# Patient Record
Sex: Female | Born: 1960 | Hispanic: Yes | Marital: Married | Smoking: Never smoker
Health system: Southern US, Community
[De-identification: ages and names within clinical notes are randomized; demographics above are authoritative.]

## PROBLEM LIST (undated history)

## (undated) HISTORY — PX: TONSILLECTOMY: SUR1361

---

## 2017-01-16 ENCOUNTER — Emergency Department (HOSPITAL_BASED_OUTPATIENT_CLINIC_OR_DEPARTMENT_OTHER): Payer: PRIVATE HEALTH INSURANCE

## 2017-01-16 ENCOUNTER — Other Ambulatory Visit: Payer: Self-pay

## 2017-01-16 ENCOUNTER — Encounter (HOSPITAL_BASED_OUTPATIENT_CLINIC_OR_DEPARTMENT_OTHER): Payer: Self-pay | Admitting: *Deleted

## 2017-01-16 ENCOUNTER — Inpatient Hospital Stay (HOSPITAL_BASED_OUTPATIENT_CLINIC_OR_DEPARTMENT_OTHER)
Admission: EM | Admit: 2017-01-16 | Discharge: 2017-01-26 | DRG: 417 | Disposition: A | Discharge status: Home / Self Care | Payer: PRIVATE HEALTH INSURANCE | Attending: Internal Medicine | Admitting: Internal Medicine

## 2017-01-16 DIAGNOSIS — K851 Biliary acute pancreatitis without necrosis or infection: Secondary | ICD-10-CM | POA: Diagnosis not present

## 2017-01-16 DIAGNOSIS — R0602 Shortness of breath: Secondary | ICD-10-CM

## 2017-01-16 DIAGNOSIS — K8591 Acute pancreatitis with uninfected necrosis, unspecified: Secondary | ICD-10-CM | POA: Diagnosis not present

## 2017-01-16 DIAGNOSIS — K8050 Calculus of bile duct without cholangitis or cholecystitis without obstruction: Secondary | ICD-10-CM

## 2017-01-16 DIAGNOSIS — E1165 Type 2 diabetes mellitus with hyperglycemia: Secondary | ICD-10-CM | POA: Diagnosis present

## 2017-01-16 DIAGNOSIS — K810 Acute cholecystitis: Secondary | ICD-10-CM | POA: Diagnosis not present

## 2017-01-16 DIAGNOSIS — K8020 Calculus of gallbladder without cholecystitis without obstruction: Secondary | ICD-10-CM | POA: Diagnosis present

## 2017-01-16 DIAGNOSIS — E119 Type 2 diabetes mellitus without complications: Secondary | ICD-10-CM

## 2017-01-16 DIAGNOSIS — K8000 Calculus of gallbladder with acute cholecystitis without obstruction: Secondary | ICD-10-CM | POA: Diagnosis not present

## 2017-01-16 DIAGNOSIS — E785 Hyperlipidemia, unspecified: Secondary | ICD-10-CM | POA: Diagnosis present

## 2017-01-16 DIAGNOSIS — E876 Hypokalemia: Secondary | ICD-10-CM | POA: Diagnosis present

## 2017-01-16 DIAGNOSIS — J90 Pleural effusion, not elsewhere classified: Secondary | ICD-10-CM | POA: Diagnosis present

## 2017-01-16 DIAGNOSIS — R109 Unspecified abdominal pain: Secondary | ICD-10-CM | POA: Diagnosis present

## 2017-01-16 DIAGNOSIS — R1011 Right upper quadrant pain: Secondary | ICD-10-CM

## 2017-01-16 DIAGNOSIS — K8063 Calculus of gallbladder and bile duct with acute cholecystitis with obstruction: Secondary | ICD-10-CM | POA: Diagnosis not present

## 2017-01-16 DIAGNOSIS — R945 Abnormal results of liver function studies: Secondary | ICD-10-CM | POA: Diagnosis present

## 2017-01-16 DIAGNOSIS — K8067 Calculus of gallbladder and bile duct with acute and chronic cholecystitis with obstruction: Secondary | ICD-10-CM | POA: Diagnosis present

## 2017-01-16 DIAGNOSIS — R197 Diarrhea, unspecified: Secondary | ICD-10-CM | POA: Diagnosis present

## 2017-01-16 DIAGNOSIS — Z419 Encounter for procedure for purposes other than remedying health state, unspecified: Secondary | ICD-10-CM

## 2017-01-16 DIAGNOSIS — K819 Cholecystitis, unspecified: Secondary | ICD-10-CM

## 2017-01-16 DIAGNOSIS — K8071 Calculus of gallbladder and bile duct without cholecystitis with obstruction: Secondary | ICD-10-CM

## 2017-01-16 DIAGNOSIS — R748 Abnormal levels of other serum enzymes: Secondary | ICD-10-CM

## 2017-01-16 DIAGNOSIS — K7689 Other specified diseases of liver: Secondary | ICD-10-CM | POA: Diagnosis not present

## 2017-01-16 DIAGNOSIS — R739 Hyperglycemia, unspecified: Secondary | ICD-10-CM | POA: Diagnosis present

## 2017-01-16 DIAGNOSIS — K56609 Unspecified intestinal obstruction, unspecified as to partial versus complete obstruction: Secondary | ICD-10-CM

## 2017-01-16 DIAGNOSIS — K8511 Biliary acute pancreatitis with uninfected necrosis: Secondary | ICD-10-CM | POA: Diagnosis present

## 2017-01-16 LAB — COMPREHENSIVE METABOLIC PANEL
ALBUMIN: 4.7 g/dL (ref 3.5–5.0)
ALK PHOS: 157 U/L — AB (ref 38–126)
ALT: 871 U/L — ABNORMAL HIGH (ref 14–54)
ANION GAP: 15 (ref 5–15)
AST: 953 U/L — ABNORMAL HIGH (ref 15–41)
BILIRUBIN TOTAL: 2.6 mg/dL — AB (ref 0.3–1.2)
BUN: 20 mg/dL (ref 6–20)
CALCIUM: 9.6 mg/dL (ref 8.9–10.3)
CO2: 20 mmol/L — ABNORMAL LOW (ref 22–32)
Chloride: 102 mmol/L (ref 101–111)
Creatinine, Ser: 1 mg/dL (ref 0.44–1.00)
GFR calc non Af Amer: >60 mL/min (ref >60)
Glucose, Bld: 239 mg/dL — ABNORMAL HIGH (ref 65–99)
Potassium: 3.8 mmol/L (ref 3.5–5.1)
Sodium: 137 mmol/L (ref 135–145)
TOTAL PROTEIN: 8.5 g/dL — AB (ref 6.5–8.1)

## 2017-01-16 LAB — CBC WITH DIFFERENTIAL/PLATELET
BASOS ABS: 0 10*3/uL (ref 0.0–0.1)
BASOS PCT: 0 %
EOS ABS: 0 10*3/uL (ref 0.0–0.7)
EOS PCT: 0 %
HEMATOCRIT: 51.5 % — AB (ref 36.0–46.0)
Hemoglobin: 17.6 g/dL — ABNORMAL HIGH (ref 12.0–15.0)
Lymphocytes Relative: 5 %
Lymphs Abs: 0.6 10*3/uL — ABNORMAL LOW (ref 0.7–4.0)
MCH: 27.3 pg (ref 26.0–34.0)
MCHC: 34.2 g/dL (ref 30.0–36.0)
MCV: 80 fL (ref 78.0–100.0)
MONO ABS: 0.4 10*3/uL (ref 0.1–1.0)
Monocytes Relative: 3 %
NEUTROS ABS: 10.1 10*3/uL — AB (ref 1.7–7.7)
Neutrophils Relative %: 92 %
PLATELETS: 332 10*3/uL (ref 150–400)
RBC: 6.44 MIL/uL — ABNORMAL HIGH (ref 3.87–5.11)
RDW: 14.2 % (ref 11.5–15.5)
WBC: 11 10*3/uL — ABNORMAL HIGH (ref 4.0–10.5)

## 2017-01-16 LAB — LIPASE, BLOOD: LIPASE: 1907 U/L — AB (ref 11–51)

## 2017-01-16 MED ORDER — ONDANSETRON HCL 4 MG/2ML IJ SOLN
4.0000 mg | Freq: Three times a day (TID) | INTRAMUSCULAR | Status: DC | PRN
  Administered 2017-01-16 – 2017-01-18 (×3): 4 mg via INTRAVENOUS
  Filled 2017-01-16 (×3): qty 2

## 2017-01-16 MED ORDER — MORPHINE SULFATE (PF) 4 MG/ML IV SOLN
INTRAVENOUS | Status: AC
  Administered 2017-01-16: 4 mg via INTRAVENOUS
  Filled 2017-01-16: qty 1

## 2017-01-16 MED ORDER — PIPERACILLIN-TAZOBACTAM 3.375 G IVPB 30 MIN
3.3750 g | Freq: Once | INTRAVENOUS | Status: AC
  Administered 2017-01-16: 3.375 g via INTRAVENOUS
  Filled 2017-01-16 (×2): qty 50

## 2017-01-16 MED ORDER — HYDROMORPHONE HCL 1 MG/ML IJ SOLN
0.5000 mg | Freq: Once | INTRAMUSCULAR | Status: AC
  Administered 2017-01-16: 0.5 mg via INTRAVENOUS
  Filled 2017-01-16: qty 1

## 2017-01-16 MED ORDER — HYDROMORPHONE HCL 1 MG/ML IJ SOLN
1.0000 mg | INTRAMUSCULAR | Status: DC | PRN
  Administered 2017-01-16 (×2): 0.5 mg via INTRAVENOUS
  Filled 2017-01-16: qty 1

## 2017-01-16 MED ORDER — HYDROMORPHONE HCL 1 MG/ML IJ SOLN
INTRAMUSCULAR | Status: AC
  Administered 2017-01-16: 0.5 mg via INTRAVENOUS
  Filled 2017-01-16: qty 1

## 2017-01-16 MED ORDER — SODIUM CHLORIDE 0.9 % IV BOLUS (SEPSIS)
1000.0000 mL | Freq: Once | INTRAVENOUS | Status: AC
  Administered 2017-01-16: 1000 mL via INTRAVENOUS

## 2017-01-16 MED ORDER — ONDANSETRON HCL 4 MG/2ML IJ SOLN
4.0000 mg | Freq: Once | INTRAMUSCULAR | Status: AC
  Administered 2017-01-16: 4 mg via INTRAVENOUS

## 2017-01-16 MED ORDER — ONDANSETRON HCL 4 MG/2ML IJ SOLN
INTRAMUSCULAR | Status: AC
  Administered 2017-01-16: 4 mg via INTRAVENOUS
  Filled 2017-01-16: qty 2

## 2017-01-16 MED ORDER — SODIUM CHLORIDE 0.9 % IV SOLN
INTRAVENOUS | Status: DC
  Administered 2017-01-16: 23:00:00 via INTRAVENOUS

## 2017-01-16 MED ORDER — MORPHINE SULFATE (PF) 4 MG/ML IV SOLN
4.0000 mg | Freq: Once | INTRAVENOUS | Status: AC
  Administered 2017-01-16: 4 mg via INTRAVENOUS

## 2017-01-16 NOTE — ED Notes (Signed)
Patient transported to Ultrasound and CT. 

## 2017-01-16 NOTE — ED Triage Notes (Signed)
Abdominal pain and vomiting. Pain is in her right upper quadrant radiating into her back.

## 2017-01-16 NOTE — ED Notes (Signed)
Pt sister went home and took all pts clothes and jewelry except  Wedding ring and silver color bracelet. Left number to call when admitted; [place] [phone]

## 2017-01-16 NOTE — Progress Notes (Signed)
This is a no charge note  [name] [name]  56 year old lady without significant past medical history, who presents with nausea, vomiting, right upper quadrant abdominal pain. Patient was found to have abnormal liver function with AST 953, ALT 873, total bilirubin 2.6, ALP 157. Lipase 1907, Abdominal ultrasound showed multiple gallstones and cholecystitis.  WBC 11.0, electrolytes renal function okay, temperature 97.4, no tachycardia, no tachypnea, blood pressure 178/72, blood sugar 279,. Patient is admitted to MedSurg bed as inpatient. Gen. surgeon, Dr. Michaell CowingGross was consulted by EDP, recommended to consult GI for possible ERCP. Patient is started with IV Zosyn.  Please call manager of [facility] at [phone] when pt arrives to floor   [name], MD  [facility] Pager [phone]  If 7PM-7AM, please contact night-coverage [email or web address] Password [email or web address] 01/16/2017, 10:41 PM

## 2017-01-16 NOTE — ED Notes (Signed)
Report given to [facility], states 15mins out; [facility], states will call back for report

## 2017-01-16 NOTE — ED Notes (Signed)
ED Provider at bedside. 

## 2017-01-16 NOTE — ED Provider Notes (Signed)
[facility] EMERGENCY DEPARTMENT Provider Note   CSN: [id number] Arrival date & time: 01/16/17  1819     History   Chief Complaint Chief Complaint  Patient presents with  . Abdominal Pain    HPI Kristen Sharp is a 56 y.o. female.  HPI 56 year old female with no pertinent past medical history presents to the emergency department today with complaints of right upper quadrant abdominal pain.  The patient states the pain started this morning when she awoke from sleep.  States the pain is sharp and stabbing in nature.  States the pain is constant.  States she has had several episodes of nonbloody nonbilious emesis today.  Denies any change in her bowel habits, melena, hematochezia.  She is not take anything for the pain prior to arrival.  Patient denies any associated fever, chills, urinary symptoms, vaginal symptoms.  Nothing makes her symptoms better.  Palpation and movement makes the pain worse.  Patient has had poor p.o. intake today due to the pain.  No history of same.  Patient denies any history of abdominal surgeries.  Pt denies any fever, chill, ha, vision changes, lightheadedness, dizziness, congestion, neck pain, cp, sob, cough,  urinary symptoms, change in bowel habits, melena, hematochezia, lower extremity paresthesias.  History reviewed. No pertinent past medical history.  Patient Active Problem List   Diagnosis Date Noted  . Acute cholecystitis 01/16/2017  . Cholelithiasis 01/16/2017  . Abnormal liver function 01/16/2017  . Gallstone pancreatitis 01/16/2017  . Hyperglycemia 01/16/2017    Past Surgical History:  Procedure Laterality Date  . CESAREAN SECTION    . TONSILLECTOMY      OB History    No data available       Home Medications    Prior to Admission medications   Not on File    Family History No family history on file.  Social History Social History   Tobacco Use  . Smoking status: Never Smoker  . Smokeless tobacco: Never Used    Substance Use Topics  . Alcohol use: No    Frequency: Never  . Drug use: No     Allergies   Patient has no known allergies.   Review of Systems Review of Systems  Constitutional: Negative for chills and fever.  HENT: Negative for congestion.   Eyes: Negative for visual disturbance.  Respiratory: Negative for cough and shortness of breath.   Cardiovascular: Negative for chest pain.  Gastrointestinal: Positive for abdominal pain, nausea and vomiting. Negative for diarrhea.  Genitourinary: Negative for dysuria, flank pain, frequency, hematuria, urgency, vaginal bleeding and vaginal discharge.  Musculoskeletal: Negative for arthralgias and myalgias.  Skin: Negative for rash.  Neurological: Negative for dizziness, syncope, weakness, light-headedness, numbness and headaches.  Psychiatric/Behavioral: Negative for sleep disturbance. The patient is not nervous/anxious.      Physical Exam Updated Vital Signs BP (!) 178/72   Pulse 70   Temp (!) 97.4 F (36.3 C) (Oral)   Resp 20   Ht 5' (1.524 m)   Wt 73.5 kg (162 lb)   SpO2 98%   BMI 31.64 kg/m   Physical Exam  Constitutional: She is oriented to person, place, and time. She appears well-developed and well-nourished.  Non-toxic appearance. No distress.  HENT:  Head: Normocephalic and atraumatic.  Nose: Nose normal.  Mouth/Throat: Oropharynx is clear and moist.  Eyes: Conjunctivae are normal. Pupils are equal, round, and reactive to light. Right eye exhibits no discharge. Left eye exhibits no discharge. No scleral icterus.  Neck: Normal  range of motion. Neck supple.  Cardiovascular: Normal rate, regular rhythm, normal heart sounds and intact distal pulses. Exam reveals no gallop and no friction rub.  No murmur heard. Pulmonary/Chest: Effort normal and breath sounds normal. No stridor. No respiratory distress. She has no wheezes. She has no rales. She exhibits no tenderness.  Abdominal: Soft. Bowel sounds are normal. There is  generalized tenderness and tenderness in the right upper quadrant, epigastric area and left upper quadrant. There is positive Murphy's sign. There is no rigidity, no rebound, no guarding, no CVA tenderness and no tenderness at McBurney's point.  Musculoskeletal: Normal range of motion. She exhibits no tenderness.  Lymphadenopathy:    She has no cervical adenopathy.  Neurological: She is alert and oriented to person, place, and time.  Skin: Skin is warm and dry. Capillary refill takes less than 2 seconds.  Psychiatric: Her behavior is normal. Judgment and thought content normal.  Nursing note and vitals reviewed.    ED Treatments / Results  Labs (all labs ordered are listed, but only abnormal results are displayed) Labs Reviewed  COMPREHENSIVE METABOLIC PANEL - Abnormal; Notable for the following components:      Result Value   CO2 20 (*)    Glucose, Bld 239 (*)    Total Protein 8.5 (*)    AST 953 (*)    ALT 871 (*)    Alkaline Phosphatase 157 (*)    Total Bilirubin 2.6 (*)    All other components within normal limits  CBC WITH DIFFERENTIAL/PLATELET - Abnormal; Notable for the following components:   WBC 11.0 (*)    RBC 6.44 (*)    Hemoglobin 17.6 (*)    HCT 51.5 (*)    Neutro Abs 10.1 (*)    Lymphs Abs 0.6 (*)    All other components within normal limits  LIPASE, BLOOD - Abnormal; Notable for the following components:   Lipase 1,907 (*)    All other components within normal limits  URINALYSIS, ROUTINE W REFLEX MICROSCOPIC    EKG  EKG Interpretation None       Radiology Koreas Abdomen Limited Ruq  Result Date: 01/16/2017 CLINICAL DATA:  Severe right upper quadrant and epigastric pain for the last several hours. EXAM: ULTRASOUND ABDOMEN LIMITED RIGHT UPPER QUADRANT COMPARISON:  None. FINDINGS: Gallbladder: Gallbladder full of stones. Mild surrounding fluid. Largest stone measured at 1.7 cm. Positive Murphy sign. Common bile duct: Diameter: 3.6 mm common normal Liver:  Echogenic liver suggesting fatty change. No focal lesion or ductal dilatation. Portal vein is patent on color Doppler imaging with normal direction of blood flow towards the liver. There appears to be some fluid in the right upper quadrant that may be reactive to the inflammatory changes. IMPRESSION: Gallbladder full of stones. Positive Murphy sign. Mild surrounding fluid. Findings suggest cholecystitis. Fatty liver. Electronically Signed   By: Paulina FusiMark  Shogry M.D.   On: 01/16/2017 21:10    Procedures Procedures (including critical care time)  Medications Ordered in ED Medications  HYDROmorphone (DILAUDID) injection 1 mg (not administered)  ondansetron (ZOFRAN) injection 4 mg (not administered)  sodium chloride 0.9 % bolus 1,000 mL (0 mLs Intravenous Stopped 01/16/17 2015)  morphine 4 MG/ML injection 4 mg (4 mg Intravenous Given 01/16/17 1938)  ondansetron (ZOFRAN) injection 4 mg (4 mg Intravenous Given 01/16/17 1937)  HYDROmorphone (DILAUDID) injection 0.5 mg (0.5 mg Intravenous Given 01/16/17 2011)  HYDROmorphone (DILAUDID) 1 MG/ML injection (0.5 mg  Given 01/16/17 2052)  piperacillin-tazobactam (ZOSYN) IVPB 3.375 g (0 g  Intravenous Stopped 01/16/17 2158)     Initial Impression / Assessment and Plan / ED Course  I have reviewed the triage vital signs and the nursing notes.  Pertinent labs & imaging results that were available during my care of the patient were reviewed by me and considered in my medical decision making (see chart for details).     Patient resents to the ED for evaluation of right upper quadrant abdominal pain acute onset this a.m.  Reports associated emesis.  Vital signs are reassuring.  Patient is afebrile, no tachycardia, no hypotension noted.  Lab work noticeable for mild leukocytosis of 11,000.  She also has elevated liver enzymes and lipase of 1900.  Total bilirubin is elevated at 2.6.  Patient also has an elevated glucose of 239.  Mild hemoconcentration of  hemoglobin of 17.6.  On exam patient does have significant pain in the right upper quadrant epigastric region to palpation.  No signs of peritonitis.  Positive Murphy sign.  Lungs clear to auscultation bilaterally.  Heart regular rate and rhythm with no rubs murmurs or gallops.  No significant scleral icterus or jaundice noted.  Given the elevated liver enzymes and lipase concern for possible gallstone pancreatitis versus cholangitis versus acute cholecystitis.  Ultrasound will be ordered to evaluate.  Ultrasound returns that reveals a gallbladder full of gallstones with acute cholecystitis noted.  Normal common bile duct.  I spoke with Dr. gross with general surgery who recommends hospital admission and gastroenterology follow-up.  He will see patient in consultation in the hospital tomorrow.  Does not recommend any emergent surgical intervention at this time.  Patient will be given IV antibiotics.  Spoke with Dr. new with hospital medicine who agrees to admission will place admission orders.  Patient remains hemodynamically stable this time.  Pain is been controlled in the ED but is been very difficult.  Patient has no signs of peritonitis or a rigid abdomen.  [place] seen by my attending who is agreed with the above plan.  Final Clinical Impressions(s) / ED Diagnoses   Final diagnoses:  Elevated liver enzymes  RUQ pain  Elevated lipase  Cholecystitis    ED Discharge Orders    None       Wallace KellerLeaphart, Kenneth T, PA-C 01/17/17 0134    Pricilla LovelessGoldston, Scott, MD 01/18/17 [phone]

## 2017-01-17 ENCOUNTER — Inpatient Hospital Stay (HOSPITAL_COMMUNITY): Payer: PRIVATE HEALTH INSURANCE

## 2017-01-17 DIAGNOSIS — R1011 Right upper quadrant pain: Secondary | ICD-10-CM

## 2017-01-17 DIAGNOSIS — R109 Unspecified abdominal pain: Secondary | ICD-10-CM | POA: Diagnosis present

## 2017-01-17 DIAGNOSIS — R739 Hyperglycemia, unspecified: Secondary | ICD-10-CM

## 2017-01-17 DIAGNOSIS — K819 Cholecystitis, unspecified: Secondary | ICD-10-CM

## 2017-01-17 DIAGNOSIS — K7689 Other specified diseases of liver: Secondary | ICD-10-CM

## 2017-01-17 DIAGNOSIS — K8000 Calculus of gallbladder with acute cholecystitis without obstruction: Secondary | ICD-10-CM

## 2017-01-17 DIAGNOSIS — K810 Acute cholecystitis: Secondary | ICD-10-CM

## 2017-01-17 DIAGNOSIS — E119 Type 2 diabetes mellitus without complications: Secondary | ICD-10-CM

## 2017-01-17 LAB — URINALYSIS, ROUTINE W REFLEX MICROSCOPIC
BILIRUBIN URINE: NEGATIVE
Bacteria, UA: NONE SEEN
GLUCOSE, UA: 50 mg/dL — AB
Hgb urine dipstick: NEGATIVE
KETONES UR: 5 mg/dL — AB
LEUKOCYTES UA: NEGATIVE
Nitrite: NEGATIVE
PH: 5 (ref 5.0–8.0)
Protein, ur: 30 mg/dL — AB
SPECIFIC GRAVITY, URINE: 1.024 (ref 1.005–1.030)
SQUAMOUS EPITHELIAL / LPF: NONE SEEN

## 2017-01-17 LAB — GLUCOSE, CAPILLARY
GLUCOSE-CAPILLARY: 130 mg/dL — AB (ref 65–99)
GLUCOSE-CAPILLARY: 201 mg/dL — AB (ref 65–99)
Glucose-Capillary: 187 mg/dL — ABNORMAL HIGH (ref 65–99)
Glucose-Capillary: 138 mg/dL — ABNORMAL HIGH (ref 65–99)
Glucose-Capillary: 109 mg/dL — ABNORMAL HIGH (ref 65–99)

## 2017-01-17 LAB — PROTIME-INR
INR: 1
Prothrombin Time: 13.1 seconds (ref 11.4–15.2)

## 2017-01-17 LAB — APTT: APTT: 28 s (ref 24–36)

## 2017-01-17 LAB — HIV ANTIBODY (ROUTINE TESTING W REFLEX): HIV Screen 4th Generation wRfx: NONREACTIVE

## 2017-01-17 LAB — TRIGLYCERIDES: Triglycerides: 135 mg/dL (ref <150)

## 2017-01-17 MED ORDER — MAGIC MOUTHWASH
15.0000 mL | Freq: Four times a day (QID) | ORAL | Status: DC | PRN
  Administered 2017-01-23: 15 mL via ORAL
  Filled 2017-01-17: qty 15

## 2017-01-17 MED ORDER — MENTHOL 3 MG MT LOZG: 1.0000 | LOZENGE | OROMUCOSAL | Status: DC | PRN

## 2017-01-17 MED ORDER — PROCHLORPERAZINE EDISYLATE 5 MG/ML IJ SOLN: 5.0000 mg | INTRAMUSCULAR | Status: DC | PRN

## 2017-01-17 MED ORDER — INSULIN ASPART 100 UNIT/ML ~~LOC~~ SOLN
0.0000 [IU] | Freq: Three times a day (TID) | SUBCUTANEOUS | Status: DC
  Administered 2017-01-17: 1 [IU] via SUBCUTANEOUS
  Administered 2017-01-17: 2 [IU] via SUBCUTANEOUS
  Administered 2017-01-17 – 2017-01-18 (×2): 1 [IU] via SUBCUTANEOUS
  Administered 2017-01-19 (×2): 2 [IU] via SUBCUTANEOUS
  Administered 2017-01-21 – 2017-01-23 (×3): 1 [IU] via SUBCUTANEOUS
  Administered 2017-01-23 – 2017-01-24 (×3): 2 [IU] via SUBCUTANEOUS
  Administered 2017-01-25 – 2017-01-26 (×3): 1 [IU] via SUBCUTANEOUS

## 2017-01-17 MED ORDER — GADOBENATE DIMEGLUMINE 529 MG/ML IV SOLN
15.0000 mL | Freq: Once | INTRAVENOUS | Status: AC | PRN
  Administered 2017-01-17: 15 mL via INTRAVENOUS

## 2017-01-17 MED ORDER — ALUM & MAG HYDROXIDE-SIMETH 200-200-20 MG/5ML PO SUSP: 30.0000 mL | Freq: Four times a day (QID) | ORAL | Status: DC | PRN

## 2017-01-17 MED ORDER — GUAIFENESIN-DM 100-10 MG/5ML PO SYRP: 10.0000 mL | ORAL_SOLUTION | ORAL | Status: DC | PRN

## 2017-01-17 MED ORDER — DIPHENHYDRAMINE HCL 25 MG PO CAPS: 25.0000 mg | ORAL_CAPSULE | Freq: Four times a day (QID) | ORAL | Status: DC | PRN

## 2017-01-17 MED ORDER — LIP MEDEX EX OINT
1.0000 "application " | TOPICAL_OINTMENT | Freq: Two times a day (BID) | CUTANEOUS | Status: DC
  Administered 2017-01-17 – 2017-01-20 (×6): 1 via TOPICAL
  Filled 2017-01-17: qty 7

## 2017-01-17 MED ORDER — SACCHAROMYCES BOULARDII 250 MG PO CAPS
250.0000 mg | ORAL_CAPSULE | Freq: Two times a day (BID) | ORAL | Status: DC
  Administered 2017-01-17 – 2017-01-22 (×9): 250 mg via ORAL
  Filled 2017-01-17 (×10): qty 1

## 2017-01-17 MED ORDER — INSULIN ASPART 100 UNIT/ML ~~LOC~~ SOLN
0.0000 [IU] | Freq: Every day | SUBCUTANEOUS | Status: DC
  Administered 2017-01-17: 2 [IU] via SUBCUTANEOUS
  Administered 2017-01-20: 1 [IU] via SUBCUTANEOUS
  Administered 2017-01-22: 2 [IU] via SUBCUTANEOUS

## 2017-01-17 MED ORDER — BISACODYL 10 MG RE SUPP: 10.0000 mg | Freq: Two times a day (BID) | RECTAL | Status: DC | PRN

## 2017-01-17 MED ORDER — ORAL CARE MOUTH RINSE
15.0000 mL | Freq: Two times a day (BID) | OROMUCOSAL | Status: DC
  Administered 2017-01-17 – 2017-01-25 (×15): 15 mL via OROMUCOSAL

## 2017-01-17 MED ORDER — PIPERACILLIN-TAZOBACTAM 3.375 G IVPB
3.3750 g | Freq: Three times a day (TID) | INTRAVENOUS | Status: DC
  Administered 2017-01-17 – 2017-01-23 (×19): 3.375 g via INTRAVENOUS
  Filled 2017-01-17 (×19): qty 50

## 2017-01-17 MED ORDER — HYDROCORTISONE 1 % EX CREA
1.0000 "application " | TOPICAL_CREAM | Freq: Three times a day (TID) | CUTANEOUS | Status: DC | PRN
  Filled 2017-01-17: qty 28

## 2017-01-17 MED ORDER — HYDROCORTISONE 2.5 % RE CREA
1.0000 "application " | TOPICAL_CREAM | Freq: Four times a day (QID) | RECTAL | Status: DC | PRN
  Filled 2017-01-17: qty 28.35

## 2017-01-17 MED ORDER — PHENOL 1.4 % MT LIQD: 1.0000 | OROMUCOSAL | Status: DC | PRN

## 2017-01-17 MED ORDER — ZOLPIDEM TARTRATE 5 MG PO TABS: 5.0000 mg | ORAL_TABLET | Freq: Every evening | ORAL | Status: DC | PRN

## 2017-01-17 MED ORDER — DIPHENHYDRAMINE HCL 50 MG/ML IJ SOLN: 12.5000 mg | Freq: Four times a day (QID) | INTRAMUSCULAR | Status: DC | PRN

## 2017-01-17 MED ORDER — FAMOTIDINE IN NACL 20-0.9 MG/50ML-% IV SOLN
20.0000 mg | Freq: Two times a day (BID) | INTRAVENOUS | Status: DC
  Administered 2017-01-17 – 2017-01-26 (×20): 20 mg via INTRAVENOUS
  Filled 2017-01-17 (×21): qty 50

## 2017-01-17 MED ORDER — HYDROMORPHONE HCL 1 MG/ML IJ SOLN
0.5000 mg | INTRAMUSCULAR | Status: DC | PRN
  Administered 2017-01-17 (×3): 0.5 mg via INTRAVENOUS
  Filled 2017-01-17 (×4): qty 0.5

## 2017-01-17 MED ORDER — SODIUM CHLORIDE 0.9 % IV SOLN
INTRAVENOUS | Status: AC
  Administered 2017-01-17 (×2): via INTRAVENOUS

## 2017-01-17 MED ORDER — PROMETHAZINE HCL 25 MG/ML IJ SOLN
12.5000 mg | Freq: Once | INTRAMUSCULAR | Status: AC
  Administered 2017-01-17: 03:00:00 12.5 mg via INTRAVENOUS
  Filled 2017-01-17: qty 1

## 2017-01-17 MED ORDER — LACTATED RINGERS IV BOLUS (SEPSIS): 1000.0000 mL | Freq: Three times a day (TID) | INTRAVENOUS | Status: AC | PRN

## 2017-01-17 MED ORDER — METOCLOPRAMIDE HCL 5 MG/ML IJ SOLN
5.0000 mg | Freq: Four times a day (QID) | INTRAMUSCULAR | Status: DC | PRN
  Administered 2017-01-17 – 2017-01-21 (×2): 5 mg via INTRAVENOUS
  Filled 2017-01-17 (×2): qty 2

## 2017-01-17 MED ORDER — HYDROMORPHONE HCL 1 MG/ML IJ SOLN
0.5000 mg | INTRAMUSCULAR | Status: DC | PRN
  Administered 2017-01-17 – 2017-01-18 (×10): 0.5 mg via INTRAVENOUS
  Filled 2017-01-17 (×9): qty 0.5

## 2017-01-17 MED ORDER — IBUPROFEN 200 MG PO TABS
200.0000 mg | ORAL_TABLET | Freq: Four times a day (QID) | ORAL | Status: DC | PRN
  Administered 2017-01-18: 200 mg via ORAL
  Filled 2017-01-17: qty 1

## 2017-01-17 MED ORDER — HYDROMORPHONE HCL 1 MG/ML IJ SOLN
0.5000 mg | Freq: Once | INTRAMUSCULAR | Status: AC
  Administered 2017-01-17: 03:00:00 0.5 mg via INTRAVENOUS
  Filled 2017-01-17: qty 0.5

## 2017-01-17 NOTE — Progress Notes (Addendum)
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 Recommended CTA for further eval.   - CTA Abd order placed.   Kathi DerParag Shriya Aker MD, FACP 01/17/2017, 4:28 PM  Contact #  [phone]

## 2017-01-17 NOTE — Consult Note (Signed)
Referring Provider:  Dr. Clyde LundborgNiu /ED Primary Care Physician:  Patient, No Pcp Per Primary Gastroenterologist: Gentry FitzUnassigned  Reason for Consultation:  Abdominal pain, elevated LFTs, possible gallstone pancreatitis  HPI: Kristen Sharp is a 56 y.o. female presents to the hospital with acute episode of nausea, vomiting and abdominal pain. GI is consulted for further evaluation. [place] and she was on her way to airport  when she started having acute episode of vomiting followed by epigastric pain. Epigastric pain was severe with radiation towards the back. Denied similar symptoms in the past. Denied diarrhea or constipation. Because of ongoing symptoms she was brought to emergency room for further evaluation where she was found to have elevated LFTs as well as elevated lipase of 1900. Ultrasound showed changes of possible cholecystitis with multiple gallstones. CBD is 3.6 mm.  [place] colon[place] 7 [place] ago in [place]. No previous EGD. Denied significant alcohol use.  History reviewed. No pertinent past medical history.  Past Surgical History:  Procedure Laterality Date  . CESAREAN SECTION    . TONSILLECTOMY      Prior to Admission medications   Not on File    Scheduled Meds: . insulin aspart  0-5 Units Subcutaneous QHS  . insulin aspart  0-9 Units Subcutaneous TID WC  . lip balm  1 application Topical BID  . mouth rinse  15 mL Mouth Rinse BID  . saccharomyces boulardii  250 mg Oral BID   Continuous Infusions: . sodium chloride 150 mL/hr at 01/17/17 0435  . famotidine (PEPCID) IV Stopped (01/17/17 1024)  . lactated ringers    . piperacillin-tazobactam (ZOSYN)  IV 3.375 g (01/17/17 1217)   PRN Meds:.alum & mag hydroxide-simeth, bisacodyl, diphenhydrAMINE, diphenhydrAMINE, guaiFENesin-dextromethorphan, hydrocortisone, hydrocortisone cream, HYDROmorphone (DILAUDID) injection, ibuprofen, lactated ringers, magic mouthwash, menthol-cetylpyridinium, metoCLOPramide (REGLAN) injection,  ondansetron (ZOFRAN) IV, phenol, prochlorperazine, zolpidem  Allergies as of 01/16/2017  . (No Known Allergies)    No family history on file.  Social History   Socioeconomic History  . Marital status: Married    Spouse name: Not on file  . Number of children: Not on file  . Years of education: Not on file  . Highest education level: Not on file  Social Needs  . Financial resource strain: Not on file  . Food insecurity - worry: Not on file  . Food insecurity - inability: Not on file  . Transportation needs - medical: Not on file  . Transportation needs - non-medical: Not on file  Occupational History  . Not on file  Tobacco Use  . Smoking status: Never Smoker  . Smokeless tobacco: Never Used  Substance and Sexual Activity  . Alcohol use: No    Frequency: Never  . Drug use: No  . Sexual activity: Not on file  Other Topics Concern  . Not on file  Social History Narrative  . Not on file    Review of Systems: Review of Systems  Constitutional: Positive for malaise/fatigue. Negative for chills and fever.  HENT: Negative for hearing loss and tinnitus.   Eyes: Negative for blurred vision and double vision.  Respiratory: Negative for cough and hemoptysis.   Cardiovascular: Negative for chest pain and palpitations.  Gastrointestinal: Positive for abdominal pain, nausea and vomiting. Negative for blood in stool, constipation, diarrhea and melena.  Genitourinary: Negative for dysuria and urgency.  Musculoskeletal: Positive for back pain.  Skin: Negative for itching and rash.  Neurological: Positive for weakness. Negative for dizziness and headaches.  Endo/Heme/Allergies: Does not  bruise/bleed easily.  Psychiatric/Behavioral: Negative for hallucinations and suicidal ideas.    Physical Exam: Vital signs: Vitals:   01/17/17 0030 01/17/17 0710  BP: (!) 155/88 (!) 141/81  Pulse: 82 (!) 116  Resp: 20 20  Temp: 98.2 F (36.8 C) 98.9 F (37.2 C)  SpO2: 100% 99%      Physical Exam  Constitutional: She is oriented to person, place, and time. She appears well-developed and well-nourished. She appears distressed.  Mild distress from abdominal pain  HENT:  Head: Normocephalic and atraumatic.  Mouth/Throat: No oropharyngeal exudate.  Eyes: EOM are normal. No scleral icterus.  Neck: Normal range of motion. Neck supple. No thyromegaly present.  Cardiovascular: Normal rate, regular rhythm and normal heart sounds.  Pulmonary/Chest: Effort normal and breath sounds normal. No respiratory distress.  Abdominal: Soft. Bowel sounds are normal. There is tenderness. There is guarding. There is no rebound.  Musculoskeletal: Normal range of motion. She exhibits no edema.  Neurological: She is alert and oriented to person, place, and time.  Skin: Skin is warm. No erythema.  Psychiatric: She has a normal mood and affect. Judgment normal.  Vitals reviewed.   GI:  Lab Results: Recent Labs    01/16/17 1925  WBC 11.0*  HGB 17.6*  HCT 51.5*  PLT 332   BMET Recent Labs    01/16/17 1925  NA 137  K 3.8  CL 102  CO2 20*  GLUCOSE 239*  BUN 20  CREATININE 1.00  CALCIUM 9.6   LFT Recent Labs    01/16/17 1925  PROT 8.5*  ALBUMIN 4.7  AST 953*  ALT 871*  ALKPHOS 157*  BILITOT 2.6*   PT/INR Recent Labs    01/17/17 0110  LABPROT 13.1  INR 1.00     Studies/Results: Koreas Abdomen Limited Ruq  Result Date: 01/16/2017 CLINICAL DATA:  Severe right upper quadrant and epigastric pain for the last several hours. EXAM: ULTRASOUND ABDOMEN LIMITED RIGHT UPPER QUADRANT COMPARISON:  None. FINDINGS: Gallbladder: Gallbladder full of stones. Mild surrounding fluid. Largest stone measured at 1.7 cm. Positive Murphy sign. Common bile duct: Diameter: 3.6 mm common normal Liver: Echogenic liver suggesting fatty change. No focal lesion or ductal dilatation. Portal vein is patent on color Doppler imaging with normal direction of blood flow towards the liver. There appears  to be some fluid in the right upper quadrant that may be reactive to the inflammatory changes. IMPRESSION: Gallbladder full of stones. Positive Murphy sign. Mild surrounding fluid. Findings suggest cholecystitis. Fatty liver. Electronically Signed   By: Paulina FusiMark  Shogry M.D.   On: 01/16/2017 21:10    Impression/Plan: - Gallstone pancreatitis. Patient with Abdominal pain along with elevated lipase more than 1900 along with elevated LFTs. Ultrasound showed findings suggestive of possible cholecystitis with multiple gallstones. CBD of 3.6 mm.  - Elevated LFTs. Most likely she has passed gallstone. CBD of 3.6 mm argues again any CBD obstruction/CBD filling defects. Sometimes underlying cholecystitis might cause elevation of LFTs but not usually this high.  Recommendations ---------------------- - Increase IV fluids to 250 mL/h for next 12 hours. - MRI-MRCP for further evaluation of CBD as well as to assess severity of her pancreatitis. - Repeat CBC, CMP in the morning. Check triglyceride level. - Further plan based on MRI-MRCP findings.   LOS: 1 day   Kathi DerParag Sharice Harriss  MD, FACP 01/17/2017, 12:21 PM  Contact #  [phone]

## 2017-01-17 NOTE — Progress Notes (Signed)
Pharmacy Antibiotic Note  Traci Sermonndra Yonker is a 56 y.o. female with nausea, vomiting and abdominal pain admitted on 01/16/2017 with acute cholecystits.  Pharmacy has been consulted for zosyn dosing.  Plan: Zosyn 3.375g IV q8h (4 hour infusion). F/u scr/cultures  Height: 5' (152.4 cm) Weight: 162 lb (73.5 kg) IBW/kg (Calculated) : 45.5  Temp (24hrs), Avg:97.8 F (36.6 C), Min:97.4 F (36.3 C), Max:98.2 F (36.8 C)  Recent Labs  Lab 01/16/17 1925  WBC 11.0*  CREATININE 1.00    Estimated Creatinine Clearance: 55.6 mL/min (by C-G formula based on SCr of 1 mg/dL).    No Known Allergies  Antimicrobials this admission: 12/13 zosyn >>    >>   Dose adjustments this admission:   Microbiology results:  BCx:   UCx:    Sputum:    MRSA PCR:   Thank you for allowing pharmacy to be a part of this patient's care.  Lorenza EvangelistGreen, Kanai Hilger R 01/17/2017 12:54 AM

## 2017-01-17 NOTE — Progress Notes (Signed)
Patient transferred to room 1533. [facility]. All belongings with patient.

## 2017-01-17 NOTE — Progress Notes (Signed)
Patient is crying, moaning and saying "I can't bear this".  Dr Roda ShuttersXu paged. Lina SarBeth Higinio Grow, RN

## 2017-01-17 NOTE — Consult Note (Signed)
Kristen  [address] [place], [place] [address] Phone: [phone] FAX: [phone]     Kristen Sharp  May 12, 1960 [id number]  CARE TEAM:  PCP: Patient, No Pcp Per  Outpatient Care Team: Patient Care Team: Patient, No Pcp Per as PCP - General (General Practice)  Inpatient Treatment Team: Treatment Team: Attending Provider: Florencia Reasons, MD; Registered Nurse: Lynetta Mare, RN; Rounding Team: Fatima Blank, MD; Consulting Physician: Edison Pace, Md, MD   This patient is a 56 y.o.female who presents today for surgical evaluation at the request of T. Leaphart, [facility]P PA.   Chief complaint / Reason for evaluation: Pancreatitis and gallstones  56 year old female lives up in [place].  Was visiting her brother down in [place].  Had episode of severe abdominal pain with nausea and vomiting.  Flight home canceled by brother.  She had worsening symptoms over the next 24 hours.  Came to emergency room.  Concern for elevated liver function tests and lipase for probable cholecystitis and pancreatitis.  Surgical consultation requested.  Normally does not have major problems with eating.  This attack was rather severe with pain radiating to her middle back.  Definite nausea and vomiting.  No personal nor family history of GI/colon cancer, inflammatory bowel disease, irritable bowel syndrome, allergy such as Celiac Sprue, dietary/dairy problems, colitis, ulcers nor gastritis.  No recent sick contacts/gastroenteritis.  No travel outside the country.  No changes in diet.  No dysphagia to solids or liquids.  No significant heartburn or reflux.  No hematochezia, hematemesis, coffee ground emesis.  No evidence of prior gastric/peptic ulceration.    Assessment  Kristen Sharp  56 y.o. female       Problem List:  Principal Problem:   Gallstone pancreatitis Active Problems:   Cholecystitis   Cholelithiasis   Abnormal liver function    Hyperglycemia   Abdominal pain   Severe episode of nausea vomiting abdominal pain with history physical and studies suspicious for gallstone pancreatitis  Plan:  -N.p.o.  IV fluids.  Serial blood and physical examinations.  If lipase and liver function tests resolve/improve, most likely argues that common bile duct stone has passed and could proceed with cholecystectomy once pain and pancreas numbers have normalized.  Perhaps in the next 2-3 days.  If worsening liver function tests, consider gastroenterology evaluation with probable MRCP and/or ERCP and then cholecystectomy after that.  Do not know if she truly is infected.  No indication for antibiotics purely for pancreatitis.  Defer to admitting service on persistent use of antibiotics.  Glucose above 200 = diabetes.  Check A1c.  Patient tends to avoid doctors, so I do not know what her true health status is.  Defer to medicine service for further evaluation.  -VTE prophylaxis- SCDs, etc  -mobilize as tolerated to help recovery  30 minutes spent in review, evaluation, examination, counseling, and coordination of care.  More than 50% of that time was spent in counseling especially with patient's brother  Adin Hector, M.D., F.A.C.S. Gastrointestinal and Minimally Invasive Surgery [facility] [address], [place], [place] [address] [phone] Main / Paging   01/17/2017      History reviewed. No pertinent past medical history.  Past Surgical History:  Procedure Laterality Date  . CESAREAN SECTION    . TONSILLECTOMY      Social History   Socioeconomic History  . Marital status: Married    Spouse name: Not on file  .  Number of children: Not on file  . Years of education: Not on file  . Highest education level: Not on file  Social Needs  . Financial resource strain: Not on file  . Food insecurity - worry: Not on file  . Food insecurity - inability: Not on file  .  Transportation needs - medical: Not on file  . Transportation needs - non-medical: Not on file  Occupational History  . Not on file  Tobacco Use  . Smoking status: Never Smoker  . Smokeless tobacco: Never Used  Substance and Sexual Activity  . Alcohol use: No    Frequency: Never  . Drug use: No  . Sexual activity: Not on file  Other Topics Concern  . Not on file  Social History Narrative  . Not on file    No family history on file.  Current Facility-Administered Medications  Medication Dose Route Frequency Provider Last Rate Last Dose  . 0.9 %  sodium chloride infusion   Intravenous Continuous Ivor Costa, MD 150 mL/hr at 01/17/17 0435    . famotidine (PEPCID) IVPB 20 mg premix  20 mg Intravenous Q12H Ivor Costa, MD   Stopped at 01/17/17 0130  . HYDROmorphone (DILAUDID) injection 0.5 mg  0.5 mg Intravenous Q3H PRN Ivor Costa, MD   0.5 mg at 01/17/17 0054  . ibuprofen (ADVIL,MOTRIN) tablet 200 mg  200 mg Oral Q6H PRN Ivor Costa, MD      . insulin aspart (novoLOG) injection 0-5 Units  0-5 Units Subcutaneous QHS Ivor Costa, MD   2 Units at 01/17/17 0103  . insulin aspart (novoLOG) injection 0-9 Units  0-9 Units Subcutaneous TID WC Ivor Costa, MD      . MEDLINE mouth rinse  15 mL Mouth Rinse BID Ivor Costa, MD      . ondansetron [facility]) injection 4 mg  4 mg Intravenous Q8H PRN Ivor Costa, MD   4 mg at 01/16/17 2306  . piperacillin-tazobactam (ZOSYN) IVPB 3.375 g  3.375 g Intravenous Q8H Dorrene German, RPH 12.5 mL/hr at 01/17/17 0435 3.375 g at 01/17/17 0435  . zolpidem (AMBIEN) tablet 5 mg  5 mg Oral QHS PRN Ivor Costa, MD         No Known Allergies  ROS:   All other systems reviewed & are negative except per HPI or as noted below: Constitutional:  No fevers, chills, sweats.  Weight stable Eyes:  No vision changes, No discharge HENT:  No sore throats, nasal drainage Lymph: No neck swelling, No bruising easily Pulmonary:  No cough, productive sputum CV: No orthopnea, PND   Patient walks 20 minutes for about 1 miles without difficulty.  No exertional chest/neck/shoulder/arm pain. GI:  No personal nor family history of GI/colon cancer, inflammatory bowel disease, irritable bowel syndrome, allergy such as Celiac Sprue, dietary/dairy problems, colitis, ulcers nor gastritis.  No recent sick contacts/gastroenteritis.  No travel outside the country.  No changes in diet. Renal: No UTIs, No hematuria Genital:  No drainage, bleeding, masses Musculoskeletal: No severe joint pain.  Good ROM major joints Skin:  No sores or lesions.  No rashes Heme/Lymph:  No easy bleeding.  No swollen lymph nodes Neuro: No focal weakness/numbness.  No seizures Psych: No suicidal ideation.  No hallucinations  BP (!) 155/88   Pulse 82   Temp 98.2 F (36.8 C) (Oral)   Resp 20   Ht 5' (1.524 m)   Wt 73.5 kg (162 lb)   SpO2 100%   BMI 31.64 kg/m  Physical Exam: General: Pt exhausted, sleeping.  Will awaken and mild discomfort but no severe distress  Eyes: PERRL, normal EOM. Sclera nonicteric Neuro: CN II-XII intact w/o focal sensory/motor deficits. Lymph: No head/neck/groin lymphadenopathy Psych:  No delerium/psychosis/paranoia HENT: Normocephalic, Mucus membranes moist.  No thrush Neck: Supple, No tracheal deviation Chest: No pain.  Good respiratory excursion. CV:  Pulses intact.  Regular rhythm Abdomen: Obese but Soft, Mildly distended.  Some discomfort in epigastric and right upper quadrant region.  Questionable Murphy sign.  No incarcerated hernias. Gen:  No inguinal hernias.  No inguinal lymphadenopathy.   Ext:  SCDs BLE.  No significant edema.  No cyanosis Skin: No petechiae / purpurea.  No major sores Musculoskeletal: No severe joint pain.  Good ROM major joints   Results:   Labs: Results for orders placed or performed during the hospital encounter of 01/16/17 (from the past 48 hour(s))  Comprehensive metabolic panel     Status: Abnormal   Collection Time: 01/16/17   7:25 PM  Result Value Ref Range   Sodium 137 135 - 145 mmol/L   Potassium 3.8 3.5 - 5.1 mmol/L   Chloride 102 101 - 111 mmol/L   CO2 20 (L) 22 - 32 mmol/L   Glucose, Bld 239 (H) 65 - 99 mg/dL   BUN 20 6 - 20 mg/dL   Creatinine, Ser 1.00 0.44 - 1.00 mg/dL   Calcium 9.6 8.9 - 10.3 mg/dL   Total Protein 8.5 (H) 6.5 - 8.1 g/dL   Albumin 4.7 3.5 - 5.0 g/dL   AST 953 (H) 15 - 41 U/L   ALT 871 (H) 14 - 54 U/L   Alkaline Phosphatase 157 (H) 38 - 126 U/L   Total Bilirubin 2.6 (H) 0.3 - 1.2 mg/dL   GFR calc non Af Amer >60 >60 mL/min   GFR calc Af Amer >60 >60 mL/min    Comment: (NOTE) The eGFR has been calculated using the CKD EPI equation. This calculation has not been validated in all clinical situations. eGFR's persistently <60 mL/min signify possible Chronic Kidney Disease.    Anion gap 15 5 - 15  CBC with Differential     Status: Abnormal   Collection Time: 01/16/17  7:25 PM  Result Value Ref Range   WBC 11.0 (H) 4.0 - 10.5 K/uL   RBC 6.44 (H) 3.87 - 5.11 MIL/uL   Hemoglobin 17.6 (H) 12.0 - 15.0 g/dL   HCT 51.5 (H) 36.0 - 46.0 %   MCV 80.0 78.0 - 100.0 fL   MCH 27.3 26.0 - 34.0 pg   MCHC 34.2 30.0 - 36.0 g/dL   RDW 14.2 11.5 - 15.5 %   Platelets 332 150 - 400 K/uL   Neutrophils Relative % 92 %   Neutro Abs 10.1 (H) 1.7 - 7.7 K/uL   Lymphocytes Relative 5 %   Lymphs Abs 0.6 (L) 0.7 - 4.0 K/uL   Monocytes Relative 3 %   Monocytes Absolute 0.4 0.1 - 1.0 K/uL   Eosinophils Relative 0 %   Eosinophils Absolute 0.0 0.0 - 0.7 K/uL   Basophils Relative 0 %   Basophils Absolute 0.0 0.0 - 0.1 K/uL  Lipase, blood     Status: Abnormal   Collection Time: 01/16/17  7:25 PM  Result Value Ref Range   Lipase 1,907 (H) 11 - 51 U/L    Comment: RESULTS CONFIRMED BY MANUAL DILUTION  Glucose, capillary     Status: Abnormal   Collection Time: 01/17/17 12:56 AM  Result Value  Ref Range   Glucose-Capillary 201 (H) 65 - 99 mg/dL  Protime-INR     Status: None   Collection Time: 01/17/17  1:10  AM  Result Value Ref Range   Prothrombin Time 13.1 11.4 - 15.2 seconds   INR 1.00   APTT     Status: None   Collection Time: 01/17/17  1:10 AM  Result Value Ref Range   aPTT 28 24 - 36 seconds    Imaging / Studies: US Abdomen Limited Ruq  Result Date: 01/16/2017 CLINICAL DATA:  Severe right upper quadrant and epigastric pain for the last several hours. EXAM: ULTRASOUND ABDOMEN LIMITED RIGHT UPPER QUADRANT COMPARISON:  None. FINDINGS: Gallbladder: Gallbladder full of stones. Mild surrounding fluid. Largest stone measured at 1.7 cm. Positive Murphy sign. Common bile duct: Diameter: 3.6 mm common normal Liver: Echogenic liver suggesting fatty change. No focal lesion or ductal dilatation. Portal vein is patent on color Doppler imaging with normal direction of blood flow towards the liver. There appears to be some fluid in the right upper quadrant that may be reactive to the inflammatory changes. IMPRESSION: Gallbladder full of stones. Positive Murphy sign. Mild surrounding fluid. Findings suggest cholecystitis. Fatty liver. Electronically Signed   By: Nelson Chimes M.D.   On: 01/16/2017 21:10    Medications / Allergies: per chart  Antibiotics: Anti-infectives (From admission, onward)   Start     Dose/Rate Route Frequency Ordered Stop   01/17/17 0400  piperacillin-tazobactam (ZOSYN) IVPB 3.375 g     3.375 g 12.5 mL/hr over 240 Minutes Intravenous Every 8 hours 01/17/17 0052     01/16/17 2130  piperacillin-tazobactam (ZOSYN) IVPB 3.375 g     3.375 g 100 mL/hr over 30 Minutes Intravenous  Once 01/16/17 2119 01/16/17 2158        Note: Portions of this report may have been transcribed using voice recognition software. Every effort was made to ensure accuracy; however, inadvertent computerized transcription errors may be present.   Any transcriptional errors that result from this process are unintentional.    Adin Hector, M.D., F.A.C.S. Gastrointestinal and Minimally Invasive  Surgery [facility] [address], [place], [place] [address] [phone] Main / Paging   01/17/2017

## 2017-01-17 NOTE — Progress Notes (Signed)
Patient is npo, on prn pain meds. Vital stable.  GI/General surgery following, will follow recommendations.

## 2017-01-17 NOTE — H&P (Addendum)
History and Physical    Kristen Sharp:[id number] DOB: 1960-08-23 DOA: 01/16/2017  Referring MD/NP/PA:   PCP: Patient, No Pcp Per   Patient coming from:  The patient is coming from home.  At baseline, pt is independent for most of ADL.        Chief Complaint: Abdominal pain, nausea vomiting  HPI: Kristen Sharp is a 56 y.o. female Without significant past medical history, who presents with abdominal pain and nausea and vomiting.  Patient states that her symptoms started this morning. She has nausea and vomited many times, without blood in the vomitus. Her abdominal pain is mainly located in right upper quadrant, which is constant, sharp, severe, radiating to the back. It is not aggravated or alleviated by any factors. Patient denies fever or chills. No diarrhea. Patient does not have chest pain, shortness rest, cough, symptoms of UTI or unilateral weakness.  ED Course: pt was found to have Abnormal liver functions with AST 953, ALT 873, total bilirubin 2.6, ALP 157, Lipase 1907, WBC 11.0, electrolytes renal function okay, temperature 97.4, no tachycardia, no tachypnea, blood sugar 279,. Patient is admitted to MedSurg bed as inpatient. Gen. surgeon, Dr. Michaell Cowing was consulted by EDP, recommended to consult GI for possible ERCP.   Abdominal US showed: Gallbladder full of stones. Positive Murphy sign. Mild surrounding fluid. Findings suggest cholecystitis.  Review of Systems:   General: no fevers, chills, no body weight gain, has poor appetite, has fatigue HEENT: no blurry vision, hearing changes or sore throat Respiratory: no dyspnea, coughing, wheezing CV: no chest pain, no palpitations GI: has nausea, vomiting, abdominal pain, no diarrhea, constipation GU: no dysuria, burning on urination, increased urinary frequency, hematuria  Ext: no leg edema Neuro: no unilateral weakness, numbness, or tingling, no vision change or hearing loss Skin: no rash, no skin tear. MSK: No muscle spasm, no  deformity, no limitation of range of movement in spin Heme: No easy bruising.  Travel history: No recent long distant travel.  Allergy: No Known Allergies  History reviewed. No pertinent past medical history.  Past Surgical History:  Procedure Laterality Date  . CESAREAN SECTION    . TONSILLECTOMY      Social History:  reports that  has never smoked. she has never used smokeless tobacco. She reports that she does not drink alcohol or use drugs.  Family History: Reviewed with patient, but patient does not know any family history   Prior to Admission medications   Not on File    Physical Exam: Vitals:   01/16/17 2130 01/16/17 2200 01/16/17 2230 01/17/17 0030  BP: (!) 175/89 (!) 171/85 (!) 168/85 (!) 155/88  Pulse: 79 77 78 82  Resp:   20 20  Temp:    98.2 F (36.8 C)  TempSrc:    Oral  SpO2: 99% 99% 100% 100%  Weight:      Height:       General: Not in acute distress HEENT:       Eyes: PERRL, EOMI, no scleral icterus.       ENT: No discharge from the ears and nose, no pharynx injection, no tonsillar enlargement.        Neck: No JVD, no bruit, no mass felt. Heme: No neck lymph node enlargement. Cardiac: S1/S2, RRR, No murmurs, No gallops or rubs. Respiratory: Good air movement bilaterally. No rales, wheezing, rhonchi or rubs. GI: Soft, nondistended, nontender, no rebound pain, no organomegaly, BS present. GU: No hematuria Ext: No pitting leg edema bilaterally. 2+DP/PT pulse  bilaterally. Musculoskeletal: No joint deformities, No joint redness or warmth, no limitation of ROM in spin. Skin: No rashes.  Neuro: Alert, oriented X3, cranial nerves II-XII grossly intact, moves all extremities normally. Muscle strength 5/5 in all extremities, sensation to light touch intact. Brachial reflex 2+ bilaterally. Knee reflex 1+ bilaterally. Negative Babinski's sign. Normal finger to nose test. Psych: Patient is not psychotic, no suicidal or hemocidal ideation.  Labs on Admission: I  have personally reviewed following labs and imaging studies  CBC: Recent Labs  Lab 01/16/17 1925  WBC 11.0*  NEUTROABS 10.1*  HGB 17.6*  HCT 51.5*  MCV 80.0  PLT 332   Basic Metabolic Panel: Recent Labs  Lab 01/16/17 1925  NA 137  K 3.8  CL 102  CO2 20*  GLUCOSE 239*  BUN 20  CREATININE 1.00  CALCIUM 9.6   GFR: Estimated Creatinine Clearance: 55.6 mL/min (by C-G formula based on SCr of 1 mg/dL). Liver Function Tests: Recent Labs  Lab 01/16/17 1925  AST 953*  ALT 871*  ALKPHOS 157*  BILITOT 2.6*  PROT 8.5*  ALBUMIN 4.7   Recent Labs  Lab 01/16/17 1925  LIPASE 1,907*   No results for input(s): AMMONIA in the last 168 hours. Coagulation Profile: No results for input(s): INR, PROTIME in the last 168 hours. Cardiac Enzymes: No results for input(s): CKTOTAL, CKMB, CKMBINDEX, TROPONINI in the last 168 hours. BNP (last 3 results) No results for input(s): PROBNP in the last 8760 hours. HbA1C: No results for input(s): HGBA1C in the last 72 hours. CBG: No results for input(s): GLUCAP in the last 168 hours. Lipid Profile: No results for input(s): CHOL, HDL, LDLCALC, TRIG, CHOLHDL, LDLDIRECT in the last 72 hours. Thyroid Function Tests: No results for input(s): TSH, T4TOTAL, FREET4, T3FREE, THYROIDAB in the last 72 hours. Anemia Panel: No results for input(s): VITAMINB12, FOLATE, FERRITIN, TIBC, IRON, RETICCTPCT in the last 72 hours. Urine analysis: No results found for: COLORURINE, APPEARANCEUR, LABSPEC, PHURINE, GLUCOSEU, HGBUR, BILIRUBINUR, KETONESUR, PROTEINUR, UROBILINOGEN, NITRITE, LEUKOCYTESUR Sepsis Labs: @LABRCNTIP (procalcitonin:4,lacticidven:4) )No results found for this or any previous visit (from the past 240 hour(s)).   Radiological Exams on Admission: [place] Abdomen Limited Ruq  Result Date: 01/16/2017 CLINICAL DATA:  Severe right upper quadrant and epigastric pain for the last several hours. EXAM: ULTRASOUND ABDOMEN LIMITED RIGHT UPPER QUADRANT  COMPARISON:  None. FINDINGS: Gallbladder: Gallbladder full of stones. Mild surrounding fluid. Largest stone measured at 1.7 cm. Positive Murphy sign. Common bile duct: Diameter: 3.6 mm common normal Liver: Echogenic liver suggesting fatty change. No focal lesion or ductal dilatation. Portal vein is patent on color Doppler imaging with normal direction of blood flow towards the liver. There appears to be some fluid in the right upper quadrant that may be reactive to the inflammatory changes. IMPRESSION: Gallbladder full of stones. Positive Murphy sign. Mild surrounding fluid. Findings suggest cholecystitis. Fatty liver. Electronically Signed   By: Paulina FusiMark  Shogry M.D.   On: 01/16/2017 21:10     EKG:  Not done in ED, will get one.   Assessment/Plan Principal Problem:   Abdominal pain Active Problems:   Cholecystitis   Cholelithiasis   Abnormal liver function   Gallstone pancreatitis   Hyperglycemia   Abdominal pain due to cholecystitis, cholelithiasis, gallstone pancreatitis: Patient does not meet criteria for sepsis. Currently hemodynamically stable. Blood pressure is elevated, which is likely due to pain. Gen. surgeon, Dr. Michaell CowingGross was consulted by EDP, recommended to consult GI for possible ERCP.   -will admit to med-surg bed  as inpt -continue Zosyn -When necessary Zofran for nausea, Dilaudid for pain -IV fluids: 1 L normal saline, followed by 150 mL per hour -check INR and PTT -blood culture -IV pepcid -Dr. Levora AngelBrahmbhatt of GI was consulted  Abnormal liver function: due to gallstone -avoid tylenol  Hyperglycemia: Blood sugar is 239, no history of diabetes mellitus. May be secondary to pancreatitis. -start sliding scale insulin -check A1c   DVT ppx: SCD Code Status: Full code Family Communication: None at bed side.     Disposition Plan:  Anticipate discharge back to previous home environment Consults called:  Gen. Surgeon, Dr. Michaell CowingGross and Dr. Levora AngelBrahmbhatt of GI  Admission status: medical  floor/inpt  Date of Service 01/17/2017    Lorretta HarpXilin Tavarion Babington [facility] Pager [phone]  If 7PM-7AM, please contact night-coverage [email or web address] Password [email or web address] 01/17/2017, 12:56 AM

## 2017-01-17 NOTE — Progress Notes (Signed)
Patient received into 1533 via bed from 6th floor. Patient moaning in pain and sending RN reports "her pain medication can be given in about twenty five minutes". Lina SarBeth Gillian Kluever, RN

## 2017-01-18 ENCOUNTER — Inpatient Hospital Stay (HOSPITAL_COMMUNITY): Payer: PRIVATE HEALTH INSURANCE

## 2017-01-18 ENCOUNTER — Encounter (HOSPITAL_COMMUNITY): Payer: Self-pay | Admitting: Radiology

## 2017-01-18 LAB — LIPASE, BLOOD: Lipase: 898 U/L — ABNORMAL HIGH (ref 11–51)

## 2017-01-18 LAB — COMPREHENSIVE METABOLIC PANEL
ALBUMIN: 2.6 g/dL — AB (ref 3.5–5.0)
ALT: 249 U/L — AB (ref 14–54)
AST: 104 U/L — ABNORMAL HIGH (ref 15–41)
Alkaline Phosphatase: 84 U/L (ref 38–126)
Anion gap: 5 (ref 5–15)
BUN: 11 mg/dL (ref 6–20)
CHLORIDE: 117 mmol/L — AB (ref 101–111)
CO2: 21 mmol/L — AB (ref 22–32)
CREATININE: 0.73 mg/dL (ref 0.44–1.00)
Calcium: 7.1 mg/dL — ABNORMAL LOW (ref 8.9–10.3)
GFR calc non Af Amer: >60 mL/min (ref >60)
GLUCOSE: 122 mg/dL — AB (ref 65–99)
Potassium: 3.4 mmol/L — ABNORMAL LOW (ref 3.5–5.1)
SODIUM: 143 mmol/L (ref 135–145)
Total Bilirubin: 2.1 mg/dL — ABNORMAL HIGH (ref 0.3–1.2)
Total Protein: 5.5 g/dL — ABNORMAL LOW (ref 6.5–8.1)

## 2017-01-18 LAB — HEMOGLOBIN A1C
Hgb A1c MFr Bld: 6.2 % — ABNORMAL HIGH (ref 4.8–5.6)
Mean Plasma Glucose: 131 mg/dL

## 2017-01-18 LAB — CBC
HCT: 40.1 % (ref 36.0–46.0)
HEMOGLOBIN: 13.1 g/dL (ref 12.0–15.0)
MCH: 27.5 pg (ref 26.0–34.0)
MCHC: 32.7 g/dL (ref 30.0–36.0)
MCV: 84.2 fL (ref 78.0–100.0)
PLATELETS: 198 10*3/uL (ref 150–400)
RBC: 4.76 MIL/uL (ref 3.87–5.11)
RDW: 14.2 % (ref 11.5–15.5)
WBC: 12.5 10*3/uL — ABNORMAL HIGH (ref 4.0–10.5)

## 2017-01-18 LAB — GLUCOSE, CAPILLARY
GLUCOSE-CAPILLARY: 121 mg/dL — AB (ref 65–99)
GLUCOSE-CAPILLARY: 163 mg/dL — AB (ref 65–99)
Glucose-Capillary: 109 mg/dL — ABNORMAL HIGH (ref 65–99)
Glucose-Capillary: 100 mg/dL — ABNORMAL HIGH (ref 65–99)

## 2017-01-18 LAB — MAGNESIUM: Magnesium: 1.7 mg/dL (ref 1.7–2.4)

## 2017-01-18 MED ORDER — DIPHENHYDRAMINE HCL 12.5 MG/5ML PO ELIX: 12.5000 mg | ORAL_SOLUTION | Freq: Four times a day (QID) | ORAL | Status: DC | PRN

## 2017-01-18 MED ORDER — HYDROMORPHONE 1 MG/ML IV SOLN
INTRAVENOUS | Status: DC
  Administered 2017-01-18: 3 mg via INTRAVENOUS
  Administered 2017-01-18: 0.9 mg via INTRAVENOUS
  Administered 2017-01-18: 13:00:00 via INTRAVENOUS
  Administered 2017-01-19: 1.2 mg via INTRAVENOUS
  Administered 2017-01-19 (×2): 2.1 mg via INTRAVENOUS
  Administered 2017-01-19: 3.3 mg via INTRAVENOUS
  Administered 2017-01-19: 2.1 mg via INTRAVENOUS
  Administered 2017-01-20: 3 mg via INTRAVENOUS
  Administered 2017-01-20: 19:00:00 via INTRAVENOUS
  Administered 2017-01-20: 3.3 mg via INTRAVENOUS
  Administered 2017-01-20: 03:00:00 via INTRAVENOUS
  Administered 2017-01-21: 0.3 mg via INTRAVENOUS
  Administered 2017-01-21: 4.8 mg via INTRAVENOUS
  Administered 2017-01-21: 1.2 mg via INTRAVENOUS
  Administered 2017-01-21 (×2): 3 mg via INTRAVENOUS
  Filled 2017-01-18 (×3): qty 25

## 2017-01-18 MED ORDER — NALOXONE HCL 0.4 MG/ML IJ SOLN: 0.4000 mg | INTRAMUSCULAR | Status: DC | PRN

## 2017-01-18 MED ORDER — IPRATROPIUM-ALBUTEROL 0.5-2.5 (3) MG/3ML IN SOLN: 3.0000 mL | Freq: Four times a day (QID) | RESPIRATORY_TRACT | Status: DC

## 2017-01-18 MED ORDER — LACTATED RINGERS IV SOLN
INTRAVENOUS | Status: AC
  Administered 2017-01-18 – 2017-01-20 (×4): via INTRAVENOUS

## 2017-01-18 MED ORDER — SODIUM CHLORIDE 0.9% FLUSH: 9.0000 mL | INTRAVENOUS | Status: DC | PRN

## 2017-01-18 MED ORDER — ONDANSETRON HCL 4 MG/2ML IJ SOLN
4.0000 mg | Freq: Four times a day (QID) | INTRAMUSCULAR | Status: DC | PRN
  Administered 2017-01-19 – 2017-01-20 (×2): 4 mg via INTRAVENOUS
  Filled 2017-01-18: qty 2

## 2017-01-18 MED ORDER — IOPAMIDOL (ISOVUE-370) INJECTION 76%
100.0000 mL | Freq: Once | INTRAVENOUS | Status: AC | PRN
  Administered 2017-01-18: 100 mL via INTRAVENOUS

## 2017-01-18 MED ORDER — HYDROMORPHONE HCL 1 MG/ML IJ SOLN
1.0000 mg | Freq: Once | INTRAMUSCULAR | Status: AC
  Administered 2017-01-18: 1 mg via INTRAVENOUS
  Filled 2017-01-18: qty 1

## 2017-01-18 MED ORDER — IPRATROPIUM-ALBUTEROL 0.5-2.5 (3) MG/3ML IN SOLN
3.0000 mL | Freq: Three times a day (TID) | RESPIRATORY_TRACT | Status: DC
  Filled 2017-01-18: qty 3

## 2017-01-18 MED ORDER — IPRATROPIUM-ALBUTEROL 0.5-2.5 (3) MG/3ML IN SOLN: 3.0000 mL | RESPIRATORY_TRACT | Status: DC | PRN

## 2017-01-18 MED ORDER — IOPAMIDOL (ISOVUE-370) INJECTION 76%
INTRAVENOUS | Status: AC
  Filled 2017-01-18: qty 100

## 2017-01-18 MED ORDER — DIPHENHYDRAMINE HCL 50 MG/ML IJ SOLN: 12.5000 mg | Freq: Four times a day (QID) | INTRAMUSCULAR | Status: DC | PRN

## 2017-01-18 MED ORDER — FUROSEMIDE 10 MG/ML IJ SOLN
20.0000 mg | Freq: Once | INTRAMUSCULAR | Status: AC
  Administered 2017-01-18: 20 mg via INTRAVENOUS
  Filled 2017-01-18: qty 2

## 2017-01-18 NOTE — Progress Notes (Signed)
Subjective: The patient was seen and examined at bedside. Complains of persistent upper abdominal pain, 10 out of 10 in intensity, worse with any movement, minimally relieved with pain medication for short duration of time. She has nausea but denies vomiting. She has been burping but has not had a bowel movement and has not passed any flatus.  Objective: Vital signs in last 24 hours: Temp:  [98.2 F (36.8 C)-100.5 F (38.1 C)] 100.5 F (38.1 C) (12/15 0535) Pulse Rate:  [107-114] 107 (12/15 0535) Resp:  [16] 16 (12/15 0535) BP: (137-148)/(52-72) 137/52 (12/15 0535) SpO2:  [98 %] 98 % (12/15 0535) Weight change:     PE: Appears to be in distress secondary to abdominal pain, moist mucous membranes GENERAL: Prefers to stay still due to pain, ill-appearing ABDOMEN: Soft but extremely tender to palpation especially in upper abdomen, I was not able to hear any bowel sounds, no voluntary guarding or rigidity EXTREMITIES: No edema no deformity  Lab Results: Results for orders placed or performed during the hospital encounter of 01/16/17 (from the past 48 hour(s))  Comprehensive metabolic panel     Status: Abnormal   Collection Time: 01/16/17  7:25 PM  Result Value Ref Range   Sodium 137 135 - 145 mmol/L   Potassium 3.8 3.5 - 5.1 mmol/L   Chloride 102 101 - 111 mmol/L   CO2 20 (L) 22 - 32 mmol/L   Glucose, Bld 239 (H) 65 - 99 mg/dL   BUN 20 6 - 20 mg/dL   Creatinine, Ser 1.00 0.44 - 1.00 mg/dL   Calcium 9.6 8.9 - 10.3 mg/dL   Total Protein 8.5 (H) 6.5 - 8.1 g/dL   Albumin 4.7 3.5 - 5.0 g/dL   AST 953 (H) 15 - 41 U/L   ALT 871 (H) 14 - 54 U/L   Alkaline Phosphatase 157 (H) 38 - 126 U/L   Total Bilirubin 2.6 (H) 0.3 - 1.2 mg/dL   GFR calc non Af Amer >60 >60 mL/min   GFR calc Af Amer >60 >60 mL/min    Comment: (NOTE) The eGFR has been calculated using the CKD EPI equation. This calculation has not been validated in all clinical situations. eGFR's persistently <60 mL/min signify  possible Chronic Kidney Disease.    Anion gap 15 5 - 15  CBC with Differential     Status: Abnormal   Collection Time: 01/16/17  7:25 PM  Result Value Ref Range   WBC 11.0 (H) 4.0 - 10.5 K/uL   RBC 6.44 (H) 3.87 - 5.11 MIL/uL   Hemoglobin 17.6 (H) 12.0 - 15.0 g/dL   HCT 51.5 (H) 36.0 - 46.0 %   MCV 80.0 78.0 - 100.0 fL   MCH 27.3 26.0 - 34.0 pg   MCHC 34.2 30.0 - 36.0 g/dL   RDW 14.2 11.5 - 15.5 %   Platelets 332 150 - 400 K/uL   Neutrophils Relative % 92 %   Neutro Abs 10.1 (H) 1.7 - 7.7 K/uL   Lymphocytes Relative 5 %   Lymphs Abs 0.6 (L) 0.7 - 4.0 K/uL   Monocytes Relative 3 %   Monocytes Absolute 0.4 0.1 - 1.0 K/uL   Eosinophils Relative 0 %   Eosinophils Absolute 0.0 0.0 - 0.7 K/uL   Basophils Relative 0 %   Basophils Absolute 0.0 0.0 - 0.1 K/uL  Lipase, blood     Status: Abnormal   Collection Time: 01/16/17  7:25 PM  Result Value Ref Range   Lipase 1,907 (H) 11 -  51 U/L    Comment: RESULTS CONFIRMED BY MANUAL DILUTION  Glucose, capillary     Status: Abnormal   Collection Time: 01/17/17 12:56 AM  Result Value Ref Range   Glucose-Capillary 201 (H) 65 - 99 mg/dL  Protime-INR     Status: None   Collection Time: 01/17/17  1:10 AM  Result Value Ref Range   Prothrombin Time 13.1 11.4 - 15.2 seconds   INR 1.00   APTT     Status: None   Collection Time: 01/17/17  1:10 AM  Result Value Ref Range   aPTT 28 24 - 36 seconds  Hemoglobin A1c     Status: Abnormal   Collection Time: 01/17/17  1:10 AM  Result Value Ref Range   Hgb A1c MFr Bld 6.2 (H) 4.8 - 5.6 %    Comment: (NOTE)         Prediabetes: 5.7 - 6.4         Diabetes: >6.4         Glycemic control for adults with diabetes: <7.0    Mean Plasma Glucose 131 mg/dL    Comment: (NOTE) Performed At: [facility] [address] [place], [place] [id number] Rush Farmer MD BJ:[id number]   HIV antibody (Routine Testing)     Status: None   Collection Time: 01/17/17  1:10 AM  Result Value Ref Range   HIV  Screen 4th Generation wRfx Non Reactive Non Reactive    Comment: (NOTE) Performed At: [facility], [place] [id number] Rush Farmer MD GE:[id number]   Glucose, capillary     Status: Abnormal   Collection Time: 01/17/17  7:46 AM  Result Value Ref Range   Glucose-Capillary 187 (H) 65 - 99 mg/dL  Urinalysis, Routine w reflex microscopic     Status: Abnormal   Collection Time: 01/17/17  8:40 AM  Result Value Ref Range   Color, Urine AMBER (A) YELLOW    Comment: BIOCHEMICALS MAY BE AFFECTED BY COLOR   APPearance CLEAR CLEAR   Specific Gravity, Urine 1.024 1.005 - 1.030   pH 5.0 5.0 - 8.0   Glucose, UA 50 (A) NEGATIVE mg/dL   Hgb urine dipstick NEGATIVE NEGATIVE   Bilirubin Urine NEGATIVE NEGATIVE   Ketones, ur 5 (A) NEGATIVE mg/dL   Protein, ur 30 (A) NEGATIVE mg/dL   Nitrite NEGATIVE NEGATIVE   Leukocytes, UA NEGATIVE NEGATIVE   RBC / HPF 0-5 0 - 5 RBC/hpf   WBC, UA 0-5 0 - 5 WBC/hpf   Bacteria, UA NONE SEEN NONE SEEN   Squamous Epithelial / LPF NONE SEEN NONE SEEN   Mucus PRESENT   Glucose, capillary     Status: Abnormal   Collection Time: 01/17/17 11:54 AM  Result Value Ref Range   Glucose-Capillary 138 (H) 65 - 99 mg/dL  Triglycerides     Status: None   Collection Time: 01/17/17  1:20 PM  Result Value Ref Range   Triglycerides 135 <150 mg/dL    Comment: Performed at [facility], [place] [address] [place], [place] [address]  Glucose, capillary     Status: Abnormal   Collection Time: 01/17/17  6:15 PM  Result Value Ref Range   Glucose-Capillary 130 (H) 65 - 99 mg/dL  Glucose, capillary     Status: Abnormal   Collection Time: 01/17/17 10:24 PM  Result Value Ref Range   Glucose-Capillary 109 (H) 65 - 99 mg/dL  Comprehensive metabolic panel     Status: Abnormal   Collection Time:  01/18/17  4:25 AM  Result Value Ref Range   Sodium 143 135 - 145 mmol/L   Potassium 3.4 (L) 3.5 - 5.1 mmol/L   Chloride 117 (H) 101 - 111 mmol/L   CO2 21  (L) 22 - 32 mmol/L   Glucose, Bld 122 (H) 65 - 99 mg/dL   BUN 11 6 - 20 mg/dL   Creatinine, Ser 0.73 0.44 - 1.00 mg/dL   Calcium 7.1 (L) 8.9 - 10.3 mg/dL   Total Protein 5.5 (L) 6.5 - 8.1 g/dL   Albumin 2.6 (L) 3.5 - 5.0 g/dL   AST 104 (H) 15 - 41 U/L   ALT 249 (H) 14 - 54 U/L   Alkaline Phosphatase 84 38 - 126 U/L   Total Bilirubin 2.1 (H) 0.3 - 1.2 mg/dL   GFR calc non Af Amer >60 >60 mL/min   GFR calc Af Amer >60 >60 mL/min    Comment: (NOTE) The eGFR has been calculated using the CKD EPI equation. This calculation has not been validated in all clinical situations. eGFR's persistently <60 mL/min signify possible Chronic Kidney Disease.    Anion gap 5 5 - 15  CBC     Status: Abnormal   Collection Time: 01/18/17  4:25 AM  Result Value Ref Range   WBC 12.5 (H) 4.0 - 10.5 K/uL   RBC 4.76 3.87 - 5.11 MIL/uL   Hemoglobin 13.1 12.0 - 15.0 g/dL   HCT 40.1 36.0 - 46.0 %   MCV 84.2 78.0 - 100.0 fL   MCH 27.5 26.0 - 34.0 pg   MCHC 32.7 30.0 - 36.0 g/dL   RDW 14.2 11.5 - 15.5 %   Platelets 198 150 - 400 K/uL  Lipase, blood     Status: Abnormal   Collection Time: 01/18/17  4:25 AM  Result Value Ref Range   Lipase 898 (H) 11 - 51 U/L    Comment: RESULTS CONFIRMED BY MANUAL DILUTION  Magnesium     Status: None   Collection Time: 01/18/17  4:25 AM  Result Value Ref Range   Magnesium 1.7 1.7 - 2.4 mg/dL  Glucose, capillary     Status: Abnormal   Collection Time: 01/18/17  8:02 AM  Result Value Ref Range   Glucose-Capillary 109 (H) 65 - 99 mg/dL    Studies/Results: Mr 3d Recon At Scanner  Addendum Date: 01/17/2017   ADDENDUM REPORT: 01/17/2017 16:26 ADDENDUM: These results were called by telephone at the time of interpretation on 01/17/2017 at 4:10 pm to Dr. Otis Brace , who verbally acknowledged these results. Electronically Signed   By: Richardean Sale M.D.   On: 01/17/2017 16:26   Result Date: 01/17/2017 CLINICAL DATA:  Elevated liver function studies these and is  serum lipase level. Pancreatitis suspected. Evaluate for common duct stone. EXAM: MRI ABDOMEN WITHOUT AND WITH CONTRAST (INCLUDING MRCP) TECHNIQUE: Multiplanar multisequence MR imaging of the abdomen was performed both before and after the administration of intravenous contrast. Heavily T2-weighted images of the biliary and pancreatic ducts were obtained, and three-dimensional MRCP images were rendered by post processing. CONTRAST:  51m MULTIHANCE GADOBENATE DIMEGLUMINE 529 MG/ML IV SOLN COMPARISON:  Ultrasound 01/16/2017. FINDINGS: Lower chest: Small bilateral pleural effusions with moderate dependent atelectasis at both lung bases. Hepatobiliary: The liver demonstrates diffuse steatosis with loss of signal on gradient echo opposed phase images. No focal lesions are identified on T2 or delayed post-contrast images. There is mildly heterogeneous enhancement throughout the liver on the immediate postcontrast images. Multiple gallstones are present.  There is no gallbladder wall thickening, biliary dilatation or evidence of choledocholithiasis. The common hepatic duct measures 6 mm. Pancreas: The pancreas is diffusely enlarged, especially the pancreatic head and body which demonstrates areas of intrinsic T1 shortening consistent with hemorrhage. These areas of hemorrhage are best seen on series 1100, measure up to 4.5 cm on image 58. In the areas of hemorrhage, there is poor enhancement following contrast, consistent with early pancreatic necrosis. The remainder the pancreas enhances normally. There is no pancreatic ductal dilatation. Spleen: Normal in size without focal abnormality. Adrenals/Urinary Tract: Both adrenal glands appear normal. The kidneys appear normal without evidence of urinary tract calculus or hydronephrosis. Bladder not imaged. Stomach/Bowel: The stomach appears normal. Possible mild duodenal wall thickening adjacent to the pancreatic inflammation. No evidence of bowel obstruction.  Vascular/Lymphatic: There are no enlarged abdominal lymph nodes. Limited intravascular contrast is seen within the celiac trunk and its proximal branches on the immediate postcontrast images. Contrast enhancement is seen within the hepatic artery, and no end organ ischemic changes are seen. There is appropriate flow void in these vessels on the T2 weighted images, and this could be artifactual. The superior mesenteric artery and aorta appear normal. There is suspicion of nonocclusive thrombus in the superior mesenteric vein adjacent to the inflamed pancreatic head (images 56-61 of series 1103). The portal and splenic veins are patent. Other: There is extensive retroperitoneal edema with fluid in both pararenal spaces and pericolic gutters. A small amount of ascites is present. Musculoskeletal: No acute or significant osseous findings. IMPRESSION: 1. Cholelithiasis without evidence of biliary dilatation or choledocholithiasis. 2. Extensive hemorrhagic pancreatitis with poor enhancement in the pancreatic head and neck, suspicious for early pancreatic necrosis. Extensive associated retroperitoneal edema, ill-defined fluid, ascites and small pleural effusions. 3. 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 Limited intravascular contrast seen in the celiac trunk and its branches, potentially artifactual and suboptimally evaluated due to limited resolution on this examination. Abdominal CTA should be considered to better exclude arterial stenosis/extrinsic mass effect related to the pancreatitis. Electronically Signed: By: Richardean Sale M.D. On: 01/17/2017 16:08   Mr Abdomen Mrcp Moise Boring Contast  Addendum Date: 01/17/2017   ADDENDUM REPORT: 01/17/2017 16:26 ADDENDUM: These results were called by telephone at the time of interpretation on 01/17/2017 at 4:10 pm to Dr. Otis Brace , who verbally acknowledged these results. Electronically Signed   By: Richardean Sale M.D.   On: 01/17/2017 16:26   Result  Date: 01/17/2017 CLINICAL DATA:  Elevated liver function studies these and is serum lipase level. Pancreatitis suspected. Evaluate for common duct stone. EXAM: MRI ABDOMEN WITHOUT AND WITH CONTRAST (INCLUDING MRCP) TECHNIQUE: Multiplanar multisequence MR imaging of the abdomen was performed both before and after the administration of intravenous contrast. Heavily T2-weighted images of the biliary and pancreatic ducts were obtained, and three-dimensional MRCP images were rendered by post processing. CONTRAST:  73m MULTIHANCE GADOBENATE DIMEGLUMINE 529 MG/ML IV SOLN COMPARISON:  Ultrasound 01/16/2017. FINDINGS: Lower chest: Small bilateral pleural effusions with moderate dependent atelectasis at both lung bases. Hepatobiliary: The liver demonstrates diffuse steatosis with loss of signal on gradient echo opposed phase images. No focal lesions are identified on T2 or delayed post-contrast images. There is mildly heterogeneous enhancement throughout the liver on the immediate postcontrast images. Multiple gallstones are present. There is no gallbladder wall thickening, biliary dilatation or evidence of choledocholithiasis. The common hepatic duct measures 6 mm. Pancreas: The pancreas is diffusely enlarged, especially the pancreatic head and body which demonstrates areas of  intrinsic T1 shortening consistent with hemorrhage. These areas of hemorrhage are best seen on series 1100, measure up to 4.5 cm on image 58. In the areas of hemorrhage, there is poor enhancement following contrast, consistent with early pancreatic necrosis. The remainder the pancreas enhances normally. There is no pancreatic ductal dilatation. Spleen: Normal in size without focal abnormality. Adrenals/Urinary Tract: Both adrenal glands appear normal. The kidneys appear normal without evidence of urinary tract calculus or hydronephrosis. Bladder not imaged. Stomach/Bowel: The stomach appears normal. Possible mild duodenal wall thickening adjacent to  the pancreatic inflammation. No evidence of bowel obstruction. Vascular/Lymphatic: There are no enlarged abdominal lymph nodes. Limited intravascular contrast is seen within the celiac trunk and its proximal branches on the immediate postcontrast images. Contrast enhancement is seen within the hepatic artery, and no end organ ischemic changes are seen. There is appropriate flow void in these vessels on the T2 weighted images, and this could be artifactual. The superior mesenteric artery and aorta appear normal. There is suspicion of nonocclusive thrombus in the superior mesenteric vein adjacent to the inflamed pancreatic head (images 56-61 of series 1103). The portal and splenic veins are patent. Other: There is extensive retroperitoneal edema with fluid in both pararenal spaces and pericolic gutters. A small amount of ascites is present. Musculoskeletal: No acute or significant osseous findings. IMPRESSION: 1. Cholelithiasis without evidence of biliary dilatation or choledocholithiasis. 2. Extensive hemorrhagic pancreatitis with poor enhancement in the pancreatic head and neck, suspicious for early pancreatic necrosis. Extensive associated retroperitoneal edema, ill-defined fluid, ascites and small pleural effusions. 3. 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 Limited intravascular contrast seen in the celiac trunk and its branches, potentially artifactual and suboptimally evaluated due to limited resolution on this examination. Abdominal CTA should be considered to better exclude arterial stenosis/extrinsic mass effect related to the pancreatitis. Electronically Signed: By: Richardean Sale M.D. On: 01/17/2017 16:08   Ct Angio Abd/pel W/ And/or W/o  Result Date: 01/18/2017 CLINICAL DATA:  Acute pancreatitis with MRI demonstrating nonocclusive thrombus in the superior mesenteric vein and poor opacification of the celiac axis and its branches. CTA is performed to evaluate arterial patency and  re-evaluate mesenteric venous thrombus. EXAM: CT ANGIOGRAPHY ABDOMEN AND PELVIS WITH CONTRAST AND WITHOUT CONTRAST TECHNIQUE: Multidetector CT imaging of the abdomen and pelvis was performed using the standard protocol during bolus administration of intravenous contrast. Multiplanar reconstructed images and MIPs were obtained and reviewed to evaluate the vascular anatomy. CONTRAST:  168m ISOVUE-370 IOPAMIDOL (ISOVUE-370) INJECTION 76% COMPARISON:  MRI/MRCP on 01/17/2017 FINDINGS: VASCULAR Aorta: The abdominal aorta is normally patent and demonstrates normal caliber without evidence of atherosclerosis, aneurysm or stenosis. Celiac: The celiac axis is normally patent including branch vessels. No evidence of arterial occlusion or pseudoaneurysm. Splenic and hepatic artery's are normally patent. The gastroduodenal artery shows normal patency. SMA: The superior mesenteric artery and its branches are normally patent. Renals: Bilateral single renal artery's are widely patent. IMA: Normally patent. Inflow: Normally patent bilateral iliac artery is. Proximal Outflow: Normally patent bilateral common femoral artery's and femoral bifurcations. Veins: Venous phase imaging again demonstrates similar findings to the MRI study with nonocclusive thrombus present within a focal segment of the main superior mesenteric vein trunk along its right lumen. There is no evidence of portal vein thrombus. The splenic vein is normally patent. IVC, iliac veins and common femoral veins show normal patency. Review of the MIP images confirms the above findings. NON-VASCULAR Lower chest: Bilateral small pleural effusions present with associated bilateral lower lobe  atelectasis, right greater than left. Hepatobiliary: Severe steatosis of the liver without evidence of overt cirrhosis. No focal masses identified in the liver. The gallbladder is contracted around multiple large calcified gallstones. No evidence of biliary ductal dilatation. No  evidence of choledocholithiasis by CT. Pancreas: Extensive pancreatitis noted with venous phase imaging showing necrosis of approximately 50- 70% of the pancreatic head extending into the neck of the pancreas. The body and tail of the pancreas enhance normally. Extensive inflammation is seen surrounding the pancreas with peripancreatic and retroperitoneal fluid present. Perihepatic and perisplenic fluid also present. Fluid also present in the lesser sac. No focal discrete abscess or pseudocyst identified. No evidence of pancreatic ductal dilatation, focal pancreatic mass or ductal calculi. Spleen: Normal in size without focal abnormality. Adrenals/Urinary Tract: Adrenal glands are unremarkable. Kidneys are normal, without renal calculi, focal lesion, or hydronephrosis. The bladder is decompressed by a Foley catheter. Stomach/Bowel: Bowel shows no evidence of obstruction. No free air. No significant ileus. Lymphatic: No enlarged lymph nodes identified in the abdomen or pelvis. Reproductive: Uterus and bilateral adnexa are unremarkable. Other: Scattered free fluid in the peritoneal cavity extends into the pelvis. No hernias identified. Musculoskeletal: No acute or significant osseous findings. IMPRESSION: VASCULAR 1. Similar to the MRI, nonocclusive thrombus is identified in a short segment of the main trunk of the superior mesenteric vein. No evidence of portal vein thrombus. 2. No evidence of arterial compromise with normal patency of the mesenteric arteries by CTA. No arterial pseudoaneurysm or hemorrhage identified. NON-VASCULAR 1. Extensive pancreatitis with overt necrosis of the pancreatic head by CT. Approximately 50- 70% of the pancreatic head demonstrates lack of enhancement on venous phase imaging consistent with necrosis. No associated pancreatic abscess or discrete pseudocyst is identified at this time. 2. Cholelithiasis with calcified gallstones in a contracted gallbladder. No imaging findings to suggest  acute cholecystitis, choledocholithiasis or biliary obstruction. 3. Severe hepatic steatosis without overt cirrhosis by CT. No hepatic masses identified. Electronically Signed   By: Aletta Edouard M.D.   On: 01/18/2017 10:29   US Abdomen Limited Ruq  Result Date: 01/16/2017 CLINICAL DATA:  Severe right upper quadrant and epigastric pain for the last several hours. EXAM: ULTRASOUND ABDOMEN LIMITED RIGHT UPPER QUADRANT COMPARISON:  None. FINDINGS: Gallbladder: Gallbladder full of stones. Mild surrounding fluid. Largest stone measured at 1.7 cm. Positive Murphy sign. Common bile duct: Diameter: 3.6 mm common normal Liver: Echogenic liver suggesting fatty change. No focal lesion or ductal dilatation. Portal vein is patent on color Doppler imaging with normal direction of blood flow towards the liver. There appears to be some fluid in the right upper quadrant that may be reactive to the inflammatory changes. IMPRESSION: Gallbladder full of stones. Positive Murphy sign. Mild surrounding fluid. Findings suggest cholecystitis. Fatty liver. Electronically Signed   By: Nelson Chimes M.D.   On: 01/16/2017 21:10    Medications: I have reviewed the patient's current medications.  Assessment: 1. Extensive hemorrhagic pancreatitis with overt necrosis involving 50-70% of the pancreatic head. Extensive inflammation surrounding pancreas with peripancreatic and retroperitoneal fluid Perihepatic and perisplenic fluid also present along with fluid in the lesser sac Lipase 1907 on admission. MAXIMUM TEMPERATURE 100.58F, remains tachycardic  2. Cholelithiasis, no biliary obstruction/biliary dilatation Total bilirubin 2.6-2.1, AST dropped from 953-104, AST decreased from 871-249, alkaline phosphatase normalized from 157-84? Passed CBD stone. MRCP showed common hepatic duct 6 mm in size.  3. Severe steatosis of liver 4. Nonocclusive thrombus in main trunk or superior mesenteric pain with no evidence  of portal vein  thrombosis   Plan: Was on Ringer's lactate to 241m/hr, recommend continuing at 150 mL an hour. Discussed with patient's hospitalist that pain is not adequately controlled, she will benefit from PCA pump with Dilaudid. Patient on clear liquid diet, able to tolerate. Has been started on IV Zosyn. Reviewed surgical recommendation for plans for laparoscopic cholecystectomy with IOC early next week. Recommend continue supportive management with IV fluids/adequate pain control/early enteral feeding to be advanced as tolerated.   ARonnette Juniper12/15/2018, 11:12 AM   Pager [phone] no answer or after 5 PM call [phone]

## 2017-01-18 NOTE — Progress Notes (Addendum)
General Surgery [facility]  Assessment & Plan: Biliary pancreatitis, cholelithiasis, elevated LFT's  Allow clear liquid diet today as tolerated  Continue IVF  Monitor LFT's, lipase  OOB, ambulate in halls  If continued improvement, likely for lap chole with IOC early next week.  If not improving clinically and on lab studies, may need GI consult for ERCP with stone extraction.  Will follow.  Discussed with patient and family at bedside this AM.  Addendum: lipase still elevated at 898 (improved).  Will follow.        Velora Hecklerodd M. Calayah Guadarrama, MD, [facility]       [facility]       Office: [phone]    Chief Complaint: Abd pain, biliary pancreatitis  Subjective: Patient in bed, family at bedside.  Complains of abdominal pain.  Objective: Vital signs in last 24 hours: Temp:  [98.2 F (36.8 C)-100.5 F (38.1 C)] 100.5 F (38.1 C) (12/15 0535) Pulse Rate:  [107-114] 107 (12/15 0535) Resp:  [16] 16 (12/15 0535) BP: (137-148)/(52-72) 137/52 (12/15 0535) SpO2:  [98 %] 98 % (12/15 0535)    Intake/Output from previous day: 12/14 0701 - 12/15 0700 In: 2427.5 [I.V.:2177.5; IV Piggyback:250] Out: 1400 [Urine:1400] Intake/Output this shift: No intake/output data recorded.  Physical Exam: HEENT - sclerae clear, mucous membranes moist Neck - soft Chest - clear bilaterally Cor - RRR Abdomen - soft, mild distension; moderate tenderness with guarding epigastrium and RUQ Ext - no edema, non-tender Neuro - alert & oriented, no focal deficits  Lab Results:  Recent Labs    01/16/17 1925 01/18/17 0425  WBC 11.0* 12.5*  HGB 17.6* 13.1  HCT 51.5* 40.1  PLT 332 198   BMET Recent Labs    01/16/17 1925 01/18/17 0425  NA 137 143  K 3.8 3.4*  CL 102 117*  CO2 20* 21*  GLUCOSE 239* 122*  BUN 20 11  CREATININE 1.00 0.73  CALCIUM 9.6 7.1*   PT/INR Recent Labs    01/17/17 0110  LABPROT 13.1  INR 1.00   Comprehensive Metabolic Panel:     Component Value Date/Time   NA 143 01/18/2017 0425   NA 137 01/16/2017 1925   K 3.4 (L) 01/18/2017 0425   K 3.8 01/16/2017 1925   CL 117 (H) 01/18/2017 0425   CL 102 01/16/2017 1925   CO2 21 (L) 01/18/2017 0425   CO2 20 (L) 01/16/2017 1925   BUN 11 01/18/2017 0425   BUN 20 01/16/2017 1925   CREATININE 0.73 01/18/2017 0425   CREATININE 1.00 01/16/2017 1925   GLUCOSE 122 (H) 01/18/2017 0425   GLUCOSE 239 (H) 01/16/2017 1925   CALCIUM 7.1 (L) 01/18/2017 0425   CALCIUM 9.6 01/16/2017 1925   AST 104 (H) 01/18/2017 0425   AST 953 (H) 01/16/2017 1925   ALT 249 (H) 01/18/2017 0425   ALT 871 (H) 01/16/2017 1925   ALKPHOS 84 01/18/2017 0425   ALKPHOS 157 (H) 01/16/2017 1925   BILITOT 2.1 (H) 01/18/2017 0425   BILITOT 2.6 (H) 01/16/2017 1925   PROT 5.5 (L) 01/18/2017 0425   PROT 8.5 (H) 01/16/2017 1925   ALBUMIN 2.6 (L) 01/18/2017 0425   ALBUMIN 4.7 01/16/2017 1925    Studies/Results: Mr 3d Recon At Scanner  Addendum Date: 01/17/2017   ADDENDUM REPORT: 01/17/2017 16:26 ADDENDUM: These results were called by telephone at the time of interpretation on 01/17/2017 at 4:10 pm to Dr. Kathi DerPARAG BRAHMBHATT , who verbally acknowledged these results. Electronically Signed  By: Carey BullocksWilliam  Veazey M.D.   On: 01/17/2017 16:26   Result Date: 01/17/2017 CLINICAL DATA:  Elevated liver function studies these and is serum lipase level. Pancreatitis suspected. Evaluate for common duct stone. EXAM: MRI ABDOMEN WITHOUT AND WITH CONTRAST (INCLUDING MRCP) TECHNIQUE: Multiplanar multisequence MR imaging of the abdomen was performed both before and after the administration of intravenous contrast. Heavily T2-weighted images of the biliary and pancreatic ducts were obtained, and three-dimensional MRCP images were rendered by post processing. CONTRAST:  15mL MULTIHANCE GADOBENATE DIMEGLUMINE 529 MG/ML IV SOLN COMPARISON:  Ultrasound 01/16/2017. FINDINGS: Lower chest: Small bilateral pleural effusions with moderate  dependent atelectasis at both lung bases. Hepatobiliary: The liver demonstrates diffuse steatosis with loss of signal on gradient echo opposed phase images. No focal lesions are identified on T2 or delayed post-contrast images. There is mildly heterogeneous enhancement throughout the liver on the immediate postcontrast images. Multiple gallstones are present. There is no gallbladder wall thickening, biliary dilatation or evidence of choledocholithiasis. The common hepatic duct measures 6 mm. Pancreas: The pancreas is diffusely enlarged, especially the pancreatic head and body which demonstrates areas of intrinsic T1 shortening consistent with hemorrhage. These areas of hemorrhage are best seen on series 1100, measure up to 4.5 cm on image 58. In the areas of hemorrhage, there is poor enhancement following contrast, consistent with early pancreatic necrosis. The remainder the pancreas enhances normally. There is no pancreatic ductal dilatation. Spleen: Normal in size without focal abnormality. Adrenals/Urinary Tract: Both adrenal glands appear normal. The kidneys appear normal without evidence of urinary tract calculus or hydronephrosis. Bladder not imaged. Stomach/Bowel: The stomach appears normal. Possible mild duodenal wall thickening adjacent to the pancreatic inflammation. No evidence of bowel obstruction. Vascular/Lymphatic: There are no enlarged abdominal lymph nodes. Limited intravascular contrast is seen within the celiac trunk and its proximal branches on the immediate postcontrast images. Contrast enhancement is seen within the hepatic artery, and no end organ ischemic changes are seen. There is appropriate flow void in these vessels on the T2 weighted images, and this could be artifactual. The superior mesenteric artery and aorta appear normal. There is suspicion of nonocclusive thrombus in the superior mesenteric vein adjacent to the inflamed pancreatic head (images 56-61 of series 1103). The portal and  splenic veins are patent. Other: There is extensive retroperitoneal edema with fluid in both pararenal spaces and pericolic gutters. A small amount of ascites is present. Musculoskeletal: No acute or significant osseous findings. IMPRESSION: 1. Cholelithiasis without evidence of biliary dilatation or choledocholithiasis. 2. Extensive hemorrhagic pancreatitis with poor enhancement in the pancreatic head and neck, suspicious for early pancreatic necrosis. Extensive associated retroperitoneal edema, ill-defined fluid, ascites and small pleural effusions. 3. 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 Limited intravascular contrast seen in the celiac trunk and its branches, potentially artifactual and suboptimally evaluated due to limited resolution on this examination. Abdominal CTA should be considered to better exclude arterial stenosis/extrinsic mass effect related to the pancreatitis. Electronically Signed: By: Carey BullocksWilliam  Veazey M.D. On: 01/17/2017 16:08   Mr Abdomen Mrcp Vivien RossettiW Wo Contast  Addendum Date: 01/17/2017   ADDENDUM REPORT: 01/17/2017 16:26 ADDENDUM: These results were called by telephone at the time of interpretation on 01/17/2017 at 4:10 pm to Dr. Kathi DerPARAG BRAHMBHATT , who verbally acknowledged these results. Electronically Signed   By: Carey BullocksWilliam  Veazey M.D.   On: 01/17/2017 16:26   Result Date: 01/17/2017 CLINICAL DATA:  Elevated liver function studies these and is serum lipase level. Pancreatitis suspected. Evaluate for common duct stone.  EXAM: MRI ABDOMEN WITHOUT AND WITH CONTRAST (INCLUDING MRCP) TECHNIQUE: Multiplanar multisequence MR imaging of the abdomen was performed both before and after the administration of intravenous contrast. Heavily T2-weighted images of the biliary and pancreatic ducts were obtained, and three-dimensional MRCP images were rendered by post processing. CONTRAST:  15mL MULTIHANCE GADOBENATE DIMEGLUMINE 529 MG/ML IV SOLN COMPARISON:  Ultrasound 01/16/2017.  FINDINGS: Lower chest: Small bilateral pleural effusions with moderate dependent atelectasis at both lung bases. Hepatobiliary: The liver demonstrates diffuse steatosis with loss of signal on gradient echo opposed phase images. No focal lesions are identified on T2 or delayed post-contrast images. There is mildly heterogeneous enhancement throughout the liver on the immediate postcontrast images. Multiple gallstones are present. There is no gallbladder wall thickening, biliary dilatation or evidence of choledocholithiasis. The common hepatic duct measures 6 mm. Pancreas: The pancreas is diffusely enlarged, especially the pancreatic head and body which demonstrates areas of intrinsic T1 shortening consistent with hemorrhage. These areas of hemorrhage are best seen on series 1100, measure up to 4.5 cm on image 58. In the areas of hemorrhage, there is poor enhancement following contrast, consistent with early pancreatic necrosis. The remainder the pancreas enhances normally. There is no pancreatic ductal dilatation. Spleen: Normal in size without focal abnormality. Adrenals/Urinary Tract: Both adrenal glands appear normal. The kidneys appear normal without evidence of urinary tract calculus or hydronephrosis. Bladder not imaged. Stomach/Bowel: The stomach appears normal. Possible mild duodenal wall thickening adjacent to the pancreatic inflammation. No evidence of bowel obstruction. Vascular/Lymphatic: There are no enlarged abdominal lymph nodes. Limited intravascular contrast is seen within the celiac trunk and its proximal branches on the immediate postcontrast images. Contrast enhancement is seen within the hepatic artery, and no end organ ischemic changes are seen. There is appropriate flow void in these vessels on the T2 weighted images, and this could be artifactual. The superior mesenteric artery and aorta appear normal. There is suspicion of nonocclusive thrombus in the superior mesenteric vein adjacent to the  inflamed pancreatic head (images 56-61 of series 1103). The portal and splenic veins are patent. Other: There is extensive retroperitoneal edema with fluid in both pararenal spaces and pericolic gutters. A small amount of ascites is present. Musculoskeletal: No acute or significant osseous findings. IMPRESSION: 1. Cholelithiasis without evidence of biliary dilatation or choledocholithiasis. 2. Extensive hemorrhagic pancreatitis with poor enhancement in the pancreatic head and neck, suspicious for early pancreatic necrosis. Extensive associated retroperitoneal edema, ill-defined fluid, ascites and small pleural effusions. 3. 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 Limited intravascular contrast seen in the celiac trunk and its branches, potentially artifactual and suboptimally evaluated due to limited resolution on this examination. Abdominal CTA should be considered to better exclude arterial stenosis/extrinsic mass effect related to the pancreatitis. Electronically Signed: By: Carey Bullocks M.D. On: 01/17/2017 16:08   US Abdomen Limited Ruq  Result Date: 01/16/2017 CLINICAL DATA:  Severe right upper quadrant and epigastric pain for the last several hours. EXAM: ULTRASOUND ABDOMEN LIMITED RIGHT UPPER QUADRANT COMPARISON:  None. FINDINGS: Gallbladder: Gallbladder full of stones. Mild surrounding fluid. Largest stone measured at 1.7 cm. Positive Murphy sign. Common bile duct: Diameter: 3.6 mm common normal Liver: Echogenic liver suggesting fatty change. No focal lesion or ductal dilatation. Portal vein is patent on color Doppler imaging with normal direction of blood flow towards the liver. There appears to be some fluid in the right upper quadrant that may be reactive to the inflammatory changes. IMPRESSION: Gallbladder full of stones. Positive Murphy sign. Mild surrounding  fluid. Findings suggest cholecystitis. Fatty liver. Electronically Signed   By: Paulina Fusi M.D.   On: 01/16/2017 21:10       Aritza Brunet M 01/18/2017  Patient ID: Kristen Sharp, female   DOB: 1960-12-04, 56 y.o.   MRN: [id number]

## 2017-01-18 NOTE — Progress Notes (Signed)
PROGRESS NOTE  Alyze Lauf ZOX:[id number] DOB: 09-29-1960 DOA: 01/16/2017 PCP: Patient, No Pcp Per  HPI/Recap of past 24 hours:  tmax 100.5, reports feeling sob, reports ab pain not well controlled, intermittent nauseous, no vomiting She is burping , no bm  Assessment/Plan: Principal Problem:   Gallstone pancreatitis Active Problems:   Cholecystitis   Cholelithiasis   Abnormal liver function   Hyperglycemia   Abdominal pain   DM (diabetes mellitus) (HCC)  Nacrotizing hemorrhagic pancreatitis/gallstone pancreatitis: -MRI: Extensive pancreatitis noted with venous phase imaging showing necrosis of approximately 50- 70% of the pancreatic head  -nonocclusive thrombus is identified in a short segment of the main trunk of the superior mesenteric vein. No evidence of portal vein thrombus. -lipase on presentation is 1907, trending down -wbc 12.5, hgb 13, calcium 7.1 -start Dilaudid pca pump for pain control, ivf, abx -GI/general surgery following, appreciate input. Will follow recommendations.  Sob: cxr with pleural effusion, trial dose of iv lasix Monitor volume status  H/o HLD; stopped taking statin three weeks ago due to travelling out of town. She is from [place]  Body mass index is 31.64 kg/m.  Code Status: full  Family Communication: patient   Disposition Plan: not ready to discharge   Consultants:  GI  General surgery  Procedures:  none  Antibiotics:  Zosyn from admission   Objective: BP (!) 137/52 (BP Location: Right Arm)   Pulse (!) 107   Temp (!) 100.5 F (38.1 C) (Oral)   Resp 16   Ht 5' (1.524 m)   Wt 73.5 kg (162 lb)   SpO2 98%   BMI 31.64 kg/m   Intake/Output Summary (Last 24 hours) at 01/18/2017 0911 Last data filed at 01/18/2017 0600 Gross per 24 hour  Intake 2427.5 ml  Output 950 ml  Net 1477.5 ml   Filed Weights   01/16/17 1826  Weight: 73.5 kg (162 lb)    Exam: Patient is examined daily including today on 01/18/2017,  exams remain the same as of yesterday except that has changed    General:  In pain,   Cardiovascular: sinus tachycardia  Respiratory: diminished  Abdomen: distended, tender, decreased BS  Musculoskeletal: No Edema  Neuro: alert, oriented   Data Reviewed: Basic Metabolic Panel: Recent Labs  Lab 01/16/17 1925 01/18/17 0425  NA 137 143  K 3.8 3.4*  CL 102 117*  CO2 20* 21*  GLUCOSE 239* 122*  BUN 20 11  CREATININE 1.00 0.73  CALCIUM 9.6 7.1*  MG  --  1.7   Liver Function Tests: Recent Labs  Lab 01/16/17 1925 01/18/17 0425  AST 953* 104*  ALT 871* 249*  ALKPHOS 157* 84  BILITOT 2.6* 2.1*  PROT 8.5* 5.5*  ALBUMIN 4.7 2.6*   Recent Labs  Lab 01/16/17 1925  LIPASE 1,907*   No results for input(s): AMMONIA in the last 168 hours. CBC: Recent Labs  Lab 01/16/17 1925 01/18/17 0425  WBC 11.0* 12.5*  NEUTROABS 10.1*  --   HGB 17.6* 13.1  HCT 51.5* 40.1  MCV 80.0 84.2  PLT 332 198   Cardiac Enzymes:   No results for input(s): CKTOTAL, CKMB, CKMBINDEX, TROPONINI in the last 168 hours. BNP (last 3 results) No results for input(s): BNP in the last 8760 hours.  ProBNP (last 3 results) No results for input(s): PROBNP in the last 8760 hours.  CBG: Recent Labs  Lab 01/17/17 0746 01/17/17 1154 01/17/17 1815 01/17/17 2224 01/18/17 0802  GLUCAP 187* 138* 130* 109* 109*  No results found for this or any previous visit (from the past 240 hour(s)).   Studies: Mr 3d Recon At Scanner  Addendum Date: 01/17/2017   ADDENDUM REPORT: 01/17/2017 16:26 ADDENDUM: These results were called by telephone at the time of interpretation on 01/17/2017 at 4:10 pm to Dr. Kathi Der , who verbally acknowledged these results. Electronically Signed   By: Carey Bullocks M.D.   On: 01/17/2017 16:26   Result Date: 01/17/2017 CLINICAL DATA:  Elevated liver function studies these and is serum lipase level. Pancreatitis suspected. Evaluate for common duct stone. EXAM: MRI  ABDOMEN WITHOUT AND WITH CONTRAST (INCLUDING MRCP) TECHNIQUE: Multiplanar multisequence MR imaging of the abdomen was performed both before and after the administration of intravenous contrast. Heavily T2-weighted images of the biliary and pancreatic ducts were obtained, and three-dimensional MRCP images were rendered by post processing. CONTRAST:  15mL MULTIHANCE GADOBENATE DIMEGLUMINE 529 MG/ML IV SOLN COMPARISON:  Ultrasound 01/16/2017. FINDINGS: Lower chest: Small bilateral pleural effusions with moderate dependent atelectasis at both lung bases. Hepatobiliary: The liver demonstrates diffuse steatosis with loss of signal on gradient echo opposed phase images. No focal lesions are identified on T2 or delayed post-contrast images. There is mildly heterogeneous enhancement throughout the liver on the immediate postcontrast images. Multiple gallstones are present. There is no gallbladder wall thickening, biliary dilatation or evidence of choledocholithiasis. The common hepatic duct measures 6 mm. Pancreas: The pancreas is diffusely enlarged, especially the pancreatic head and body which demonstrates areas of intrinsic T1 shortening consistent with hemorrhage. These areas of hemorrhage are best seen on series 1100, measure up to 4.5 cm on image 58. In the areas of hemorrhage, there is poor enhancement following contrast, consistent with early pancreatic necrosis. The remainder the pancreas enhances normally. There is no pancreatic ductal dilatation. Spleen: Normal in size without focal abnormality. Adrenals/Urinary Tract: Both adrenal glands appear normal. The kidneys appear normal without evidence of urinary tract calculus or hydronephrosis. Bladder not imaged. Stomach/Bowel: The stomach appears normal. Possible mild duodenal wall thickening adjacent to the pancreatic inflammation. No evidence of bowel obstruction. Vascular/Lymphatic: There are no enlarged abdominal lymph nodes. Limited intravascular contrast is  seen within the celiac trunk and its proximal branches on the immediate postcontrast images. Contrast enhancement is seen within the hepatic artery, and no end organ ischemic changes are seen. There is appropriate flow void in these vessels on the T2 weighted images, and this could be artifactual. The superior mesenteric artery and aorta appear normal. There is suspicion of nonocclusive thrombus in the superior mesenteric vein adjacent to the inflamed pancreatic head (images 56-61 of series 1103). The portal and splenic veins are patent. Other: There is extensive retroperitoneal edema with fluid in both pararenal spaces and pericolic gutters. A small amount of ascites is present. Musculoskeletal: No acute or significant osseous findings. IMPRESSION: 1. Cholelithiasis without evidence of biliary dilatation or choledocholithiasis. 2. Extensive hemorrhagic pancreatitis with poor enhancement in the pancreatic head and neck, suspicious for early pancreatic necrosis. Extensive associated retroperitoneal edema, ill-defined fluid, ascites and small pleural effusions. 3. 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 Limited intravascular contrast seen in the celiac trunk and its branches, potentially artifactual and suboptimally evaluated due to limited resolution on this examination. Abdominal CTA should be considered to better exclude arterial stenosis/extrinsic mass effect related to the pancreatitis. Electronically Signed: By: Carey Bullocks M.D. On: 01/17/2017 16:08   Mr Abdomen Mrcp Vivien Rossetti Contast  Addendum Date: 01/17/2017   ADDENDUM REPORT: 01/17/2017 16:26 ADDENDUM: These results  were called by telephone at the time of interpretation on 01/17/2017 at 4:10 pm to Dr. Kathi DerPARAG BRAHMBHATT , who verbally acknowledged these results. Electronically Signed   By: Carey BullocksWilliam  Veazey M.D.   On: 01/17/2017 16:26   Result Date: 01/17/2017 CLINICAL DATA:  Elevated liver function studies these and is serum lipase level.  Pancreatitis suspected. Evaluate for common duct stone. EXAM: MRI ABDOMEN WITHOUT AND WITH CONTRAST (INCLUDING MRCP) TECHNIQUE: Multiplanar multisequence MR imaging of the abdomen was performed both before and after the administration of intravenous contrast. Heavily T2-weighted images of the biliary and pancreatic ducts were obtained, and three-dimensional MRCP images were rendered by post processing. CONTRAST:  15mL MULTIHANCE GADOBENATE DIMEGLUMINE 529 MG/ML IV SOLN COMPARISON:  Ultrasound 01/16/2017. FINDINGS: Lower chest: Small bilateral pleural effusions with moderate dependent atelectasis at both lung bases. Hepatobiliary: The liver demonstrates diffuse steatosis with loss of signal on gradient echo opposed phase images. No focal lesions are identified on T2 or delayed post-contrast images. There is mildly heterogeneous enhancement throughout the liver on the immediate postcontrast images. Multiple gallstones are present. There is no gallbladder wall thickening, biliary dilatation or evidence of choledocholithiasis. The common hepatic duct measures 6 mm. Pancreas: The pancreas is diffusely enlarged, especially the pancreatic head and body which demonstrates areas of intrinsic T1 shortening consistent with hemorrhage. These areas of hemorrhage are best seen on series 1100, measure up to 4.5 cm on image 58. In the areas of hemorrhage, there is poor enhancement following contrast, consistent with early pancreatic necrosis. The remainder the pancreas enhances normally. There is no pancreatic ductal dilatation. Spleen: Normal in size without focal abnormality. Adrenals/Urinary Tract: Both adrenal glands appear normal. The kidneys appear normal without evidence of urinary tract calculus or hydronephrosis. Bladder not imaged. Stomach/Bowel: The stomach appears normal. Possible mild duodenal wall thickening adjacent to the pancreatic inflammation. No evidence of bowel obstruction. Vascular/Lymphatic: There are no  enlarged abdominal lymph nodes. Limited intravascular contrast is seen within the celiac trunk and its proximal branches on the immediate postcontrast images. Contrast enhancement is seen within the hepatic artery, and no end organ ischemic changes are seen. There is appropriate flow void in these vessels on the T2 weighted images, and this could be artifactual. The superior mesenteric artery and aorta appear normal. There is suspicion of nonocclusive thrombus in the superior mesenteric vein adjacent to the inflamed pancreatic head (images 56-61 of series 1103). The portal and splenic veins are patent. Other: There is extensive retroperitoneal edema with fluid in both pararenal spaces and pericolic gutters. A small amount of ascites is present. Musculoskeletal: No acute or significant osseous findings. IMPRESSION: 1. Cholelithiasis without evidence of biliary dilatation or choledocholithiasis. 2. Extensive hemorrhagic pancreatitis with poor enhancement in the pancreatic head and neck, suspicious for early pancreatic necrosis. Extensive associated retroperitoneal edema, ill-defined fluid, ascites and small pleural effusions. 3. 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 Limited intravascular contrast seen in the celiac trunk and its branches, potentially artifactual and suboptimally evaluated due to limited resolution on this examination. Abdominal CTA should be considered to better exclude arterial stenosis/extrinsic mass effect related to the pancreatitis. Electronically Signed: By: Carey BullocksWilliam  Veazey M.D. On: 01/17/2017 16:08    Scheduled Meds: . iopamidol      . insulin aspart  0-5 Units Subcutaneous QHS  . insulin aspart  0-9 Units Subcutaneous TID WC  . lip balm  1 application Topical BID  . mouth rinse  15 mL Mouth Rinse BID  . saccharomyces boulardii  250 mg  Oral BID    Continuous Infusions: . famotidine (PEPCID) IV Stopped (01/17/17 2300)  . lactated ringers    .  piperacillin-tazobactam (ZOSYN)  IV Stopped (01/18/17 0753)     Time spent: 35mins I have personally reviewed and interpreted on  01/18/2017 daily labs, tele strips, imagings as discussed above under date review session and assessment and plans.  I reviewed all nursing notes, pharmacy notes, consultant notes,  vitals, pertinent old records  I have discussed plan of care as described above with RN , patient  on 01/18/2017   Albertine GratesFang Brendin Situ MD, PhD  [facility] Pager [phone]. If 7PM-7AM, please contact night-coverage at [email or web address], password [email or web address] 01/18/2017, 9:11 AM  LOS: 2 days

## 2017-01-19 LAB — HEMOGLOBIN A1C
HEMOGLOBIN A1C: 6.1 % — AB (ref 4.8–5.6)
Mean Plasma Glucose: 128 mg/dL

## 2017-01-19 LAB — CBC WITH DIFFERENTIAL/PLATELET
Basophils Absolute: 0.1 10*3/uL (ref 0.0–0.1)
Basophils Relative: 1 %
Eosinophils Absolute: 0 10*3/uL (ref 0.0–0.7)
Eosinophils Relative: 0 %
HCT: 34 % — ABNORMAL LOW (ref 36.0–46.0)
Hemoglobin: 11.2 g/dL — ABNORMAL LOW (ref 12.0–15.0)
Lymphocytes Relative: 7 %
Lymphs Abs: 0.8 10*3/uL (ref 0.7–4.0)
MCH: 27.6 pg (ref 26.0–34.0)
MCHC: 32.9 g/dL (ref 30.0–36.0)
MCV: 83.7 fL (ref 78.0–100.0)
Monocytes Absolute: 1.1 10*3/uL — ABNORMAL HIGH (ref 0.1–1.0)
Monocytes Relative: 9 %
Neutro Abs: 9.8 10*3/uL — ABNORMAL HIGH (ref 1.7–7.7)
Neutrophils Relative %: 83 %
Platelets: 198 10*3/uL (ref 150–400)
RBC: 4.06 MIL/uL (ref 3.87–5.11)
RDW: 14.7 % (ref 11.5–15.5)
WBC: 11.8 10*3/uL — ABNORMAL HIGH (ref 4.0–10.5)

## 2017-01-19 LAB — COMPREHENSIVE METABOLIC PANEL
ALBUMIN: 2.4 g/dL — AB (ref 3.5–5.0)
ALK PHOS: 78 U/L (ref 38–126)
ALT: 157 U/L — AB (ref 14–54)
AST: 52 U/L — AB (ref 15–41)
Anion gap: 8 (ref 5–15)
BILIRUBIN TOTAL: 2.1 mg/dL — AB (ref 0.3–1.2)
BUN: 10 mg/dL (ref 6–20)
CO2: 24 mmol/L (ref 22–32)
CREATININE: 0.54 mg/dL (ref 0.44–1.00)
Calcium: 7.6 mg/dL — ABNORMAL LOW (ref 8.9–10.3)
Chloride: 108 mmol/L (ref 101–111)
GFR calc Af Amer: >60 mL/min (ref >60)
GFR calc non Af Amer: >60 mL/min (ref >60)
GLUCOSE: 120 mg/dL — AB (ref 65–99)
POTASSIUM: 3.4 mmol/L — AB (ref 3.5–5.1)
Sodium: 140 mmol/L (ref 135–145)
TOTAL PROTEIN: 5.8 g/dL — AB (ref 6.5–8.1)

## 2017-01-19 LAB — GLUCOSE, CAPILLARY
GLUCOSE-CAPILLARY: 154 mg/dL — AB (ref 65–99)
GLUCOSE-CAPILLARY: 130 mg/dL — AB (ref 65–99)
Glucose-Capillary: 117 mg/dL — ABNORMAL HIGH (ref 65–99)
Glucose-Capillary: 167 mg/dL — ABNORMAL HIGH (ref 65–99)

## 2017-01-19 LAB — LIPASE, BLOOD: Lipase: 142 U/L — ABNORMAL HIGH (ref 11–51)

## 2017-01-19 MED ORDER — FUROSEMIDE 10 MG/ML IJ SOLN
20.0000 mg | Freq: Once | INTRAMUSCULAR | Status: AC
  Administered 2017-01-19: 20 mg via INTRAVENOUS
  Filled 2017-01-19: qty 2

## 2017-01-19 MED ORDER — FUROSEMIDE 10 MG/ML IJ SOLN
40.0000 mg | Freq: Once | INTRAMUSCULAR | Status: AC
  Administered 2017-01-19: 40 mg via INTRAVENOUS
  Filled 2017-01-19: qty 4

## 2017-01-19 MED ORDER — POTASSIUM CHLORIDE 20 MEQ/15ML (10%) PO SOLN
40.0000 meq | Freq: Once | ORAL | Status: AC
  Administered 2017-01-19: 40 meq via ORAL
  Filled 2017-01-19: qty 30

## 2017-01-19 NOTE — Progress Notes (Signed)
General Surgery [facility]  Assessment & Plan: Biliary pancreatitis, cholelithiasis, elevated LFT's             Clear liquid diet tolerated - do not advance             Continue IVF - may need TNA if prolonged course of pancreatitis             Monitor LFT's, lipase - improving but still elevated - check in AM 12/17             OOB, ambulate in halls - encouraged family to assist  If continued improvement, likely for lap chole with IOC early next week.  If not improving clinically and on lab studies, may need TNA.  Will follow.  Discussed with patient and family at bedside this AM.  Addendum: lipase still elevated at 142 (improved).  Will follow.         Kristen Sharp. Kristen Snowdon, MD, [facility]       [facility]       Office: [phone]    Chief Complaint: Biliary pancreatitis  Subjective: Patient in bed, more pain this AM.  Family at bedside.  Tolerating clear liquids - limited.  Has not been OOB.  Objective: Vital signs in last 24 hours: Temp:  [98 F (36.7 C)-99.3 F (37.4 C)] 98.4 F (36.9 C) (12/16 0458) Pulse Rate:  [108-119] 108 (12/16 0458) Resp:  [19-28] 26 (12/16 0930) BP: (120-156)/(64-71) 143/69 (12/16 0458) SpO2:  [90 %-95 %] 91 % (12/16 0930)    Intake/Output from previous day: 12/15 0701 - 12/16 0700 In: 1972.5 [P.O.:60; I.V.:1762.5; IV Piggyback:150] Out: 1875 [Urine:1875] Intake/Output this shift: No intake/output data recorded.  Physical Exam: HEENT - sclerae clear, mucous membranes moist Neck - soft Abdomen - soft, mild distension; quiet; tender epigastrium and RUQ, no mass Ext - no edema, non-tender Neuro - alert & oriented, no focal deficits  Lab Results:  Recent Labs    01/18/17 0425 01/19/17 0415  WBC 12.5* 11.8*  HGB 13.1 11.2*  HCT 40.1 34.0*  PLT 198 198   BMET Recent Labs    01/18/17 0425 01/19/17 0415  NA 143 140  K 3.4* 3.4*  CL 117* 108  CO2 21* 24  GLUCOSE 122* 120*  BUN 11 10   CREATININE 0.73 0.54  CALCIUM 7.1* 7.6*   PT/INR Recent Labs    01/17/17 0110  LABPROT 13.1  INR 1.00   Comprehensive Metabolic Panel:    Component Value Date/Time   NA 140 01/19/2017 0415   NA 143 01/18/2017 0425   K 3.4 (L) 01/19/2017 0415   K 3.4 (L) 01/18/2017 0425   CL 108 01/19/2017 0415   CL 117 (H) 01/18/2017 0425   CO2 24 01/19/2017 0415   CO2 21 (L) 01/18/2017 0425   BUN 10 01/19/2017 0415   BUN 11 01/18/2017 0425   CREATININE 0.54 01/19/2017 0415   CREATININE 0.73 01/18/2017 0425   GLUCOSE 120 (H) 01/19/2017 0415   GLUCOSE 122 (H) 01/18/2017 0425   CALCIUM 7.6 (L) 01/19/2017 0415   CALCIUM 7.1 (L) 01/18/2017 0425   AST 52 (H) 01/19/2017 0415   AST 104 (H) 01/18/2017 0425   ALT 157 (H) 01/19/2017 0415   ALT 249 (H) 01/18/2017 0425   ALKPHOS 78 01/19/2017 0415   ALKPHOS 84 01/18/2017 0425   BILITOT 2.1 (H) 01/19/2017 0415   BILITOT 2.1 (H) 01/18/2017 0425   PROT 5.8 (L) 01/19/2017 [id number]  PROT 5.5 (L) 01/18/2017 0425   ALBUMIN 2.4 (L) 01/19/2017 0415   ALBUMIN 2.6 (L) 01/18/2017 0425    Studies/Results: Mr 3d Recon At Scanner  Addendum Date: 01/17/2017   ADDENDUM REPORT: 01/17/2017 16:26 ADDENDUM: These results were called by telephone at the time of interpretation on 01/17/2017 at 4:10 pm to Dr. Kathi DerPARAG Sharp , who verbally acknowledged these results. Electronically Signed   By: Kristen BullocksWilliam  Veazey Sharp.D.   On: 01/17/2017 16:26   Result Date: 01/17/2017 CLINICAL DATA:  Elevated liver function studies these and is serum lipase level. Pancreatitis suspected. Evaluate for common duct stone. EXAM: MRI ABDOMEN WITHOUT AND WITH CONTRAST (INCLUDING MRCP) TECHNIQUE: Multiplanar multisequence MR imaging of the abdomen was performed both before and after the administration of intravenous contrast. Heavily T2-weighted images of the biliary and pancreatic ducts were obtained, and three-dimensional MRCP images were rendered by post processing. CONTRAST:  15mL MULTIHANCE  GADOBENATE DIMEGLUMINE 529 MG/ML IV SOLN COMPARISON:  Ultrasound 01/16/2017. FINDINGS: Lower chest: Small bilateral pleural effusions with moderate dependent atelectasis at both lung bases. Hepatobiliary: The liver demonstrates diffuse steatosis with loss of signal on gradient echo opposed phase images. No focal lesions are identified on T2 or delayed post-contrast images. There is mildly heterogeneous enhancement throughout the liver on the immediate postcontrast images. Multiple gallstones are present. There is no gallbladder wall thickening, biliary dilatation or evidence of choledocholithiasis. The common hepatic duct measures 6 mm. Pancreas: The pancreas is diffusely enlarged, especially the pancreatic head and body which demonstrates areas of intrinsic T1 shortening consistent with hemorrhage. These areas of hemorrhage are best seen on series 1100, measure up to 4.5 cm on image 58. In the areas of hemorrhage, there is poor enhancement following contrast, consistent with early pancreatic necrosis. The remainder the pancreas enhances normally. There is no pancreatic ductal dilatation. Spleen: Normal in size without focal abnormality. Adrenals/Urinary Tract: Both adrenal glands appear normal. The kidneys appear normal without evidence of urinary tract calculus or hydronephrosis. Bladder not imaged. Stomach/Bowel: The stomach appears normal. Possible mild duodenal wall thickening adjacent to the pancreatic inflammation. No evidence of bowel obstruction. Vascular/Lymphatic: There are no enlarged abdominal lymph nodes. Limited intravascular contrast is seen within the celiac trunk and its proximal branches on the immediate postcontrast images. Contrast enhancement is seen within the hepatic artery, and no end organ ischemic changes are seen. There is appropriate flow void in these vessels on the T2 weighted images, and this could be artifactual. The superior mesenteric artery and aorta appear normal. There is  suspicion of nonocclusive thrombus in the superior mesenteric vein adjacent to the inflamed pancreatic head (images 56-61 of series 1103). The portal and splenic veins are patent. Other: There is extensive retroperitoneal edema with fluid in both pararenal spaces and pericolic gutters. A small amount of ascites is present. Musculoskeletal: No acute or significant osseous findings. IMPRESSION: 1. Cholelithiasis without evidence of biliary dilatation or choledocholithiasis. 2. Extensive hemorrhagic pancreatitis with poor enhancement in the pancreatic head and neck, suspicious for early pancreatic necrosis. Extensive associated retroperitoneal edema, ill-defined fluid, ascites and small pleural effusions. 3. 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 Limited intravascular contrast seen in the celiac trunk and its branches, potentially artifactual and suboptimally evaluated due to limited resolution on this examination. Abdominal CTA should be considered to better exclude arterial stenosis/extrinsic mass effect related to the pancreatitis. Electronically Signed: By: Kristen BullocksWilliam  Veazey Sharp.D. On: 01/17/2017 16:08   Dg Chest Port 1 View  Result Date: 01/18/2017 CLINICAL DATA:  Shortness  of breath.  Pancreatitis. EXAM: PORTABLE CHEST 1 VIEW COMPARISON:  None. FINDINGS: Cardiomediastinal silhouette is normal. Mediastinal contours appear intact. Low lung volumes. Bilateral pleural effusions with bibasilar subsegmental atelectasis. Osseous structures are without acute abnormality. Soft tissues are grossly normal. IMPRESSION: Low lung volumes. Small to moderate in size bilateral pleural effusions with bibasilar subsegmental atelectasis. Electronically Signed   By: Ted Mcalpine Sharp.D.   On: 01/18/2017 14:23   Mr Abdomen Mrcp Vivien Rossetti Contast  Addendum Date: 01/17/2017   ADDENDUM REPORT: 01/17/2017 16:26 ADDENDUM: These results were called by telephone at the time of interpretation on 01/17/2017 at 4:10 pm to  Dr. Kathi Der , who verbally acknowledged these results. Electronically Signed   By: Kristen Bullocks Sharp.D.   On: 01/17/2017 16:26   Result Date: 01/17/2017 CLINICAL DATA:  Elevated liver function studies these and is serum lipase level. Pancreatitis suspected. Evaluate for common duct stone. EXAM: MRI ABDOMEN WITHOUT AND WITH CONTRAST (INCLUDING MRCP) TECHNIQUE: Multiplanar multisequence MR imaging of the abdomen was performed both before and after the administration of intravenous contrast. Heavily T2-weighted images of the biliary and pancreatic ducts were obtained, and three-dimensional MRCP images were rendered by post processing. CONTRAST:  15mL MULTIHANCE GADOBENATE DIMEGLUMINE 529 MG/ML IV SOLN COMPARISON:  Ultrasound 01/16/2017. FINDINGS: Lower chest: Small bilateral pleural effusions with moderate dependent atelectasis at both lung bases. Hepatobiliary: The liver demonstrates diffuse steatosis with loss of signal on gradient echo opposed phase images. No focal lesions are identified on T2 or delayed post-contrast images. There is mildly heterogeneous enhancement throughout the liver on the immediate postcontrast images. Multiple gallstones are present. There is no gallbladder wall thickening, biliary dilatation or evidence of choledocholithiasis. The common hepatic duct measures 6 mm. Pancreas: The pancreas is diffusely enlarged, especially the pancreatic head and body which demonstrates areas of intrinsic T1 shortening consistent with hemorrhage. These areas of hemorrhage are best seen on series 1100, measure up to 4.5 cm on image 58. In the areas of hemorrhage, there is poor enhancement following contrast, consistent with early pancreatic necrosis. The remainder the pancreas enhances normally. There is no pancreatic ductal dilatation. Spleen: Normal in size without focal abnormality. Adrenals/Urinary Tract: Both adrenal glands appear normal. The kidneys appear normal without evidence of urinary  tract calculus or hydronephrosis. Bladder not imaged. Stomach/Bowel: The stomach appears normal. Possible mild duodenal wall thickening adjacent to the pancreatic inflammation. No evidence of bowel obstruction. Vascular/Lymphatic: There are no enlarged abdominal lymph nodes. Limited intravascular contrast is seen within the celiac trunk and its proximal branches on the immediate postcontrast images. Contrast enhancement is seen within the hepatic artery, and no end organ ischemic changes are seen. There is appropriate flow void in these vessels on the T2 weighted images, and this could be artifactual. The superior mesenteric artery and aorta appear normal. There is suspicion of nonocclusive thrombus in the superior mesenteric vein adjacent to the inflamed pancreatic head (images 56-61 of series 1103). The portal and splenic veins are patent. Other: There is extensive retroperitoneal edema with fluid in both pararenal spaces and pericolic gutters. A small amount of ascites is present. Musculoskeletal: No acute or significant osseous findings. IMPRESSION: 1. Cholelithiasis without evidence of biliary dilatation or choledocholithiasis. 2. Extensive hemorrhagic pancreatitis with poor enhancement in the pancreatic head and neck, suspicious for early pancreatic necrosis. Extensive associated retroperitoneal edema, ill-defined fluid, ascites and small pleural effusions. 3. 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 Limited intravascular contrast seen in the celiac trunk and its branches, potentially  artifactual and suboptimally evaluated due to limited resolution on this examination. Abdominal CTA should be considered to better exclude arterial stenosis/extrinsic mass effect related to the pancreatitis. Electronically Signed: By: Kristen Bullocks Sharp.D. On: 01/17/2017 16:08   Ct Angio Abd/pel W/ And/or W/o  Result Date: 01/18/2017 CLINICAL DATA:  Acute pancreatitis with MRI demonstrating nonocclusive thrombus  in the superior mesenteric vein and poor opacification of the celiac axis and its branches. CTA is performed to evaluate arterial patency and re-evaluate mesenteric venous thrombus. EXAM: CT ANGIOGRAPHY ABDOMEN AND PELVIS WITH CONTRAST AND WITHOUT CONTRAST TECHNIQUE: Multidetector CT imaging of the abdomen and pelvis was performed using the standard protocol during bolus administration of intravenous contrast. Multiplanar reconstructed images and MIPs were obtained and reviewed to evaluate the vascular anatomy. CONTRAST:  ISOVUE-370 IOPAMIDOL (ISOVUE-370) INJECTION 76% COMPARISON:  MRI/MRCP on 01/17/2017 FINDINGS: VASCULAR Aorta: The abdominal aorta is normally patent and demonstrates normal caliber without evidence of atherosclerosis, aneurysm or stenosis. Celiac: The celiac axis is normally patent including branch vessels. No evidence of arterial occlusion or pseudoaneurysm. Splenic and hepatic artery's are normally patent. The gastroduodenal artery shows normal patency. SMA: The superior mesenteric artery and its branches are normally patent. Renals: Bilateral single renal artery's are widely patent. IMA: Normally patent. Inflow: Normally patent bilateral iliac artery is. Proximal Outflow: Normally patent bilateral common femoral artery's and femoral bifurcations. Veins: Venous phase imaging again demonstrates similar findings to the MRI study with nonocclusive thrombus present within a focal segment of the main superior mesenteric vein trunk along its right lumen. There is no evidence of portal vein thrombus. The splenic vein is normally patent. IVC, iliac veins and common femoral veins show normal patency. Review of the MIP images confirms the above findings. NON-VASCULAR Lower chest: Bilateral small pleural effusions present with associated bilateral lower lobe atelectasis, right greater than left. Hepatobiliary: Severe steatosis of the liver without evidence of overt cirrhosis. No focal masses  identified in the liver. The gallbladder is contracted around multiple large calcified gallstones. No evidence of biliary ductal dilatation. No evidence of choledocholithiasis by CT. Pancreas: Extensive pancreatitis noted with venous phase imaging showing necrosis of approximately 50- 70% of the pancreatic head extending into the neck of the pancreas. The body and tail of the pancreas enhance normally. Extensive inflammation is seen surrounding the pancreas with peripancreatic and retroperitoneal fluid present. Perihepatic and perisplenic fluid also present. Fluid also present in the lesser sac. No focal discrete abscess or pseudocyst identified. No evidence of pancreatic ductal dilatation, focal pancreatic mass or ductal calculi. Spleen: Normal in size without focal abnormality. Adrenals/Urinary Tract: Adrenal glands are unremarkable. Kidneys are normal, without renal calculi, focal lesion, or hydronephrosis. The bladder is decompressed by a Foley catheter. Stomach/Bowel: Bowel shows no evidence of obstruction. No free air. No significant ileus. Lymphatic: No enlarged lymph nodes identified in the abdomen or pelvis. Reproductive: Uterus and bilateral adnexa are unremarkable. Other: Scattered free fluid in the peritoneal cavity extends into the pelvis. No hernias identified. Musculoskeletal: No acute or significant osseous findings. IMPRESSION: VASCULAR 1. Similar to the MRI, nonocclusive thrombus is identified in a short segment of the main trunk of the superior mesenteric vein. No evidence of portal vein thrombus. 2. No evidence of arterial compromise with normal patency of the mesenteric arteries by CTA. No arterial pseudoaneurysm or hemorrhage identified. NON-VASCULAR 1. Extensive pancreatitis with overt necrosis of the pancreatic head by CT. Approximately 50- 70% of the pancreatic head demonstrates lack of enhancement on venous phase imaging  consistent with necrosis. No associated pancreatic abscess or discrete  pseudocyst is identified at this time. 2. Cholelithiasis with calcified gallstones in a contracted gallbladder. No imaging findings to suggest acute cholecystitis, choledocholithiasis or biliary obstruction. 3. Severe hepatic steatosis without overt cirrhosis by CT. No hepatic masses identified. Electronically Signed   By: Irish Lack Sharp.D.   On: 01/18/2017 10:29      Kristen Sharp 01/19/2017  Patient ID: Kristen Sharp, female   DOB: 1960-11-02, 56 y.o.   MRN: [id number]

## 2017-01-19 NOTE — Progress Notes (Addendum)
PROGRESS NOTE  Kristen Sharp ZOX:[id number] DOB: 11-Sep-1960 DOA: 01/16/2017 PCP: Patient, No Pcp Per  HPI/Recap of past 24 hours:  tmax 99.3, reports continued abdominal pain, no vomiting, no bm  feeling sob,   Assessment/Plan: Principal Problem:   Gallstone pancreatitis Active Problems:   Cholecystitis   Cholelithiasis   Abnormal liver function   Hyperglycemia   Abdominal pain   DM (diabetes mellitus) (HCC)  Nacrotizing hemorrhagic pancreatitis/gallstone pancreatitis: -MRI: Extensive pancreatitis noted with venous phase imaging showing necrosis of approximately 50- 70% of the pancreatic head  -nonocclusive thrombus is identified in a short segment of the main trunk of the superior mesenteric vein. No evidence of portal vein thrombus. -lipase on presentation is 1907, trending down -monitor wbc / hgb /bun/cr/ calcium  -she is started on Dilaudid pca pump for pain control, ivf, abx -GI/general surgery following, appreciate input. Will follow recommendations.  Sob: cxr with pleural effusion,  trial dose of iv lasix x1 on 12/15, lasix  x1 on 12/16 Start nebs Monitor volume status   Hypokalemia: mild, replace k, check  Mag  H/o HLD; stopped taking statin three weeks ago due to travelling out of town. She is from [place]  Body mass index is 31.64 kg/m.  Code Status: full  Family Communication: patient   Disposition Plan: not ready to discharge   Consultants:  GI  General surgery  Procedures:  none  Antibiotics:  Zosyn from admission   Objective: BP (!) 143/69 (BP Location: Left Arm)   Pulse (!) 108   Temp 98.4 F (36.9 C) (Oral)   Resp (!) 22   Ht 5' (1.524 m)   Wt 73.5 kg (162 lb)   SpO2 90%   BMI 31.64 kg/m   Intake/Output Summary (Last 24 hours) at 01/19/2017 0825 Last data filed at 01/19/2017 0600 Gross per 24 hour  Intake 1972.5 ml  Output 1875 ml  Net 97.5 ml   Filed Weights   01/16/17 1826  Weight: 73.5 kg (162 lb)     Exam: Patient is examined daily including today on 01/19/2017, exams remain the same as of yesterday except that has changed    General:  In pain,   Cardiovascular: less sinus tachycardia  Respiratory: diminished, mild wheezing  Abdomen: distended, tender, decreased BS  Musculoskeletal: No Edema  Neuro: alert, oriented   Data Reviewed: Basic Metabolic Panel: Recent Labs  Lab 01/16/17 1925 01/18/17 0425 01/19/17 0415  NA 137 143 140  K 3.8 3.4* 3.4*  CL 102 117* 108  CO2 20* 21* 24  GLUCOSE 239* 122* 120*  BUN CREATININE 1.00 0.73 0.54  CALCIUM 9.6 7.1* 7.6*  MG  --  1.7  --    Liver Function Tests: Recent Labs  Lab 01/16/17 1925 01/18/17 0425 01/19/17 0415  AST 953* 104* 52*  ALT 871* 249* 157*  ALKPHOS 157* 84 78  BILITOT 2.6* 2.1* 2.1*  PROT 8.5* 5.5* 5.8*  ALBUMIN 4.7 2.6* 2.4*   Recent Labs  Lab 01/16/17 1925 01/18/17 0425 01/19/17 0415  LIPASE 1,907* 898* 142*   No results for input(s): AMMONIA in the last 168 hours. CBC: Recent Labs  Lab 01/16/17 1925 01/18/17 0425 01/19/17 0415  WBC 11.0* 12.5* 11.8*  NEUTROABS 10.1*  --  9.8*  HGB 17.6* 13.1 11.2*  HCT 51.5* 40.1 34.0*  MCV 80.0 84.2 83.7  PLT 332 198 198   Cardiac Enzymes:   No results for input(s): CKTOTAL, CKMB, CKMBINDEX, TROPONINI in the last 168  hours. BNP (last 3 results) No results for input(s): BNP in the last 8760 hours.  ProBNP (last 3 results) No results for input(s): PROBNP in the last 8760 hours.  CBG: Recent Labs  Lab 01/18/17 0802 01/18/17 1240 01/18/17 1646 01/18/17 2130 01/19/17 0751  GLUCAP 109* 100* 121* 163* 117*    Recent Results (from the past 240 hour(s))  Culture, blood (Routine X 2) w Reflex to ID Panel     Status: None (Preliminary result)   Collection Time: 01/17/17  1:10 AM  Result Value Ref Range Status   Specimen Description BLOOD LEFT ARM  Final   Special Requests   Final    BOTTLES DRAWN AEROBIC ONLY Blood Culture  adequate volume   Culture   Final    NO GROWTH 1 DAY Performed at [facility], [address] [place], [place] [address]    Report Status PENDING  Incomplete  Culture, blood (Routine X 2) w Reflex to ID Panel     Status: None (Preliminary result)   Collection Time: 01/17/17  1:10 AM  Result Value Ref Range Status   Specimen Description BLOOD RIGHT HAND  Final   Special Requests   Final    BOTTLES DRAWN AEROBIC AND ANAEROBIC Blood Culture adequate volume   Culture   Final    NO GROWTH 1 DAY Performed at [facility], [address] [place], [place] [address]    Report Status PENDING  Incomplete     Studies: Dg Chest Port 1 View  Result Date: 01/18/2017 CLINICAL DATA:  Shortness of breath.  Pancreatitis. EXAM: PORTABLE CHEST 1 VIEW COMPARISON:  None. FINDINGS: Cardiomediastinal silhouette is normal. Mediastinal contours appear intact. Low lung volumes. Bilateral pleural effusions with bibasilar subsegmental atelectasis. Osseous structures are without acute abnormality. Soft tissues are grossly normal. IMPRESSION: Low lung volumes. Small to moderate in size bilateral pleural effusions with bibasilar subsegmental atelectasis. Electronically Signed   By: Ted Mcalpineobrinka  Dimitrova M.D.   On: 01/18/2017 14:23   Ct Angio Abd/pel W/ And/or W/o  Result Date: 01/18/2017 CLINICAL DATA:  Acute pancreatitis with MRI demonstrating nonocclusive thrombus in the superior mesenteric vein and poor opacification of the celiac axis and its branches. CTA is performed to evaluate arterial patency and re-evaluate mesenteric venous thrombus. EXAM: CT ANGIOGRAPHY ABDOMEN AND PELVIS WITH CONTRAST AND WITHOUT CONTRAST TECHNIQUE: Multidetector CT imaging of the abdomen and pelvis was performed using the standard protocol during bolus administration of intravenous contrast. Multiplanar reconstructed images and MIPs were obtained and reviewed to evaluate the vascular anatomy. CONTRAST:  100mL ISOVUE-370  IOPAMIDOL (ISOVUE-370) INJECTION 76% COMPARISON:  MRI/MRCP on 01/17/2017 FINDINGS: VASCULAR Aorta: The abdominal aorta is normally patent and demonstrates normal caliber without evidence of atherosclerosis, aneurysm or stenosis. Celiac: The celiac axis is normally patent including branch vessels. No evidence of arterial occlusion or pseudoaneurysm. Splenic and hepatic artery's are normally patent. The gastroduodenal artery shows normal patency. SMA: The superior mesenteric artery and its branches are normally patent. Renals: Bilateral single renal artery's are widely patent. IMA: Normally patent. Inflow: Normally patent bilateral iliac artery is. Proximal Outflow: Normally patent bilateral common femoral artery's and femoral bifurcations. Veins: Venous phase imaging again demonstrates similar findings to the MRI study with nonocclusive thrombus present within a focal segment of the main superior mesenteric vein trunk along its right lumen. There is no evidence of portal vein thrombus. The splenic vein is normally patent. IVC, iliac veins and common femoral veins show normal patency. Review of the  MIP images confirms the above findings. NON-VASCULAR Lower chest: Bilateral small pleural effusions present with associated bilateral lower lobe atelectasis, right greater than left. Hepatobiliary: Severe steatosis of the liver without evidence of overt cirrhosis. No focal masses identified in the liver. The gallbladder is contracted around multiple large calcified gallstones. No evidence of biliary ductal dilatation. No evidence of choledocholithiasis by CT. Pancreas: Extensive pancreatitis noted with venous phase imaging showing necrosis of approximately 50- 70% of the pancreatic head extending into the neck of the pancreas. The body and tail of the pancreas enhance normally. Extensive inflammation is seen surrounding the pancreas with peripancreatic and retroperitoneal fluid present. Perihepatic and perisplenic fluid  also present. Fluid also present in the lesser sac. No focal discrete abscess or pseudocyst identified. No evidence of pancreatic ductal dilatation, focal pancreatic mass or ductal calculi. Spleen: Normal in size without focal abnormality. Adrenals/Urinary Tract: Adrenal glands are unremarkable. Kidneys are normal, without renal calculi, focal lesion, or hydronephrosis. The bladder is decompressed by a Foley catheter. Stomach/Bowel: Bowel shows no evidence of obstruction. No free air. No significant ileus. Lymphatic: No enlarged lymph nodes identified in the abdomen or pelvis. Reproductive: Uterus and bilateral adnexa are unremarkable. Other: Scattered free fluid in the peritoneal cavity extends into the pelvis. No hernias identified. Musculoskeletal: No acute or significant osseous findings. IMPRESSION: VASCULAR 1. Similar to the MRI, nonocclusive thrombus is identified in a short segment of the main trunk of the superior mesenteric vein. No evidence of portal vein thrombus. 2. No evidence of arterial compromise with normal patency of the mesenteric arteries by CTA. No arterial pseudoaneurysm or hemorrhage identified. NON-VASCULAR 1. Extensive pancreatitis with overt necrosis of the pancreatic head by CT. Approximately 50- 70% of the pancreatic head demonstrates lack of enhancement on venous phase imaging consistent with necrosis. No associated pancreatic abscess or discrete pseudocyst is identified at this time. 2. Cholelithiasis with calcified gallstones in a contracted gallbladder. No imaging findings to suggest acute cholecystitis, choledocholithiasis or biliary obstruction. 3. Severe hepatic steatosis without overt cirrhosis by CT. No hepatic masses identified. Electronically Signed   By: Irish Lack M.D.   On: 01/18/2017 10:29    Scheduled Meds: . HYDROmorphone   Intravenous Q4H  . insulin aspart  0-5 Units Subcutaneous QHS  . insulin aspart  0-9 Units Subcutaneous TID WC  . lip balm  1 application  Topical BID  . mouth rinse  15 mL Mouth Rinse BID  . saccharomyces boulardii  250 mg Oral BID    Continuous Infusions: . famotidine (PEPCID) IV Stopped (01/18/17 2217)  . lactated ringers 150 mL/hr at 01/18/17 1815  . piperacillin-tazobactam (ZOSYN)  IV 3.375 g (01/19/17 0531)     Time spent: I have personally reviewed and interpreted on  01/19/2017 daily labs, tele strips, imagings as discussed above under date review session and assessment and plans.  I reviewed all nursing notes, pharmacy notes, consultant notes,  vitals, pertinent old records  I have discussed plan of care as described above with RN , patient  on 01/19/2017   Albertine Grates MD, PhD  [facility] Pager [phone]. If 7PM-7AM, please contact night-coverage at [email or web address], password [facility] 01/19/2017, 8:25 AM  LOS: 3 days

## 2017-01-19 NOTE — Progress Notes (Signed)
[facility] Progress Note  Subjective: Complains of abdominal pain. No vomiting.  Objective: Vital signs in last 24 hours: Temp:  [98 F (36.7 C)-99.3 F (37.4 C)] 98.4 F (36.9 C) (12/16 0458) Pulse Rate:  [108-119] 108 (12/16 0458) Resp:  [19-28] 26 (12/16 0930) BP: (120-156)/(64-71) 143/69 (12/16 0458) SpO2:  [90 %-95 %] 91 % (12/16 0930) Weight change:    PE: Alert Abdomen; decreased bowel sounds. Soft, tender in upper abdomen.  Lab Results: Results for orders placed or performed during the hospital encounter of 01/16/17 (from the past 24 hour(s))  Glucose, capillary     Status: Abnormal   Collection Time: 01/18/17 12:40 PM  Result Value Ref Range   Glucose-Capillary 100 (H) 65 - 99 mg/dL  Glucose, capillary     Status: Abnormal   Collection Time: 01/18/17  4:46 PM  Result Value Ref Range   Glucose-Capillary 121 (H) 65 - 99 mg/dL  Glucose, capillary     Status: Abnormal   Collection Time: 01/18/17  9:30 PM  Result Value Ref Range   Glucose-Capillary 163 (H) 65 - 99 mg/dL  Comprehensive metabolic panel     Status: Abnormal   Collection Time: 01/19/17  4:15 AM  Result Value Ref Range   Sodium 140 135 - 145 mmol/L   Potassium 3.4 (L) 3.5 - 5.1 mmol/L   Chloride 108 101 - 111 mmol/L   CO2 24 22 - 32 mmol/L   Glucose, Bld 120 (H) 65 - 99 mg/dL   BUN 10 6 - 20 mg/dL   Creatinine, Ser 1.610.54 0.44 - 1.00 mg/dL   Calcium 7.6 (L) 8.9 - 10.3 mg/dL   Total Protein 5.8 (L) 6.5 - 8.1 g/dL   Albumin 2.4 (L) 3.5 - 5.0 g/dL   AST 52 (H) 15 - 41 U/L   ALT 157 (H) 14 - 54 U/L   Alkaline Phosphatase 78 38 - 126 U/L   Total Bilirubin 2.1 (H) 0.3 - 1.2 mg/dL   GFR calc non Af Amer >60 >60 mL/min   GFR calc Af Amer >60 >60 mL/min   Anion gap 8 5 - 15  Lipase, blood     Status: Abnormal   Collection Time: 01/19/17  4:15 AM  Result Value Ref Range   Lipase 142 (H) 11 - 51 U/L  CBC with Differential/Platelet     Status: Abnormal   Collection Time: 01/19/17  4:15 AM   Result Value Ref Range   WBC 11.8 (H) 4.0 - 10.5 K/uL   RBC 4.06 3.87 - 5.11 MIL/uL   Hemoglobin 11.2 (L) 12.0 - 15.0 g/dL   HCT 09.634.0 (L) 04.536.0 - 40.946.0 %   MCV 83.7 78.0 - 100.0 fL   MCH 27.6 26.0 - 34.0 pg   MCHC 32.9 30.0 - 36.0 g/dL   RDW 81.114.7 91.411.5 - 78.215.5 %   Platelets 198 150 - 400 K/uL   Neutrophils Relative % 83 %   Lymphocytes Relative 7 %   Monocytes Relative 9 %   Eosinophils Relative 0 %   Basophils Relative 1 %   Neutro Abs 9.8 (H) 1.7 - 7.7 K/uL   Lymphs Abs 0.8 0.7 - 4.0 K/uL   Monocytes Absolute 1.1 (H) 0.1 - 1.0 K/uL   Eosinophils Absolute 0.0 0.0 - 0.7 K/uL   Basophils Absolute 0.1 0.0 - 0.1 K/uL  Glucose, capillary     Status: Abnormal   Collection Time: 01/19/17  7:51 AM  Result Value Ref Range   Glucose-Capillary 117 (H)  65 - 99 mg/dL    Studies/Results: Dg Chest Port 1 View  Result Date: 01/18/2017 CLINICAL DATA:  Shortness of breath.  Pancreatitis. EXAM: PORTABLE CHEST 1 VIEW COMPARISON:  None. FINDINGS: Cardiomediastinal silhouette is normal. Mediastinal contours appear intact. Low lung volumes. Bilateral pleural effusions with bibasilar subsegmental atelectasis. Osseous structures are without acute abnormality. Soft tissues are grossly normal. IMPRESSION: Low lung volumes. Small to moderate in size bilateral pleural effusions with bibasilar subsegmental atelectasis. Electronically Signed   By: Ted Mcalpineobrinka  Dimitrova M.D.   On: 01/18/2017 14:23      Assessment: Necrotizing hemmorragic pancreatitis. (Biliary source)  Plan:   Continue supportive care.    SAM F Abeer Iversen 01/19/2017, 10:07 AM  Pager: [phone] If no answer or after 5 PM call [phone]

## 2017-01-20 ENCOUNTER — Inpatient Hospital Stay (HOSPITAL_COMMUNITY): Payer: PRIVATE HEALTH INSURANCE | Admitting: Anesthesiology

## 2017-01-20 ENCOUNTER — Encounter (HOSPITAL_COMMUNITY): Payer: Self-pay | Admitting: Anesthesiology

## 2017-01-20 ENCOUNTER — Encounter (HOSPITAL_COMMUNITY)
Admission: EM | Disposition: A | Discharge status: Home / Self Care | Payer: Self-pay | Source: Home / Self Care | Attending: Internal Medicine

## 2017-01-20 ENCOUNTER — Inpatient Hospital Stay (HOSPITAL_COMMUNITY): Payer: PRIVATE HEALTH INSURANCE

## 2017-01-20 HISTORY — PX: CHOLECYSTECTOMY: SHX55

## 2017-01-20 LAB — COMPREHENSIVE METABOLIC PANEL
ALT: 115 U/L — ABNORMAL HIGH (ref 14–54)
ANION GAP: 10 (ref 5–15)
AST: 30 U/L (ref 15–41)
Albumin: 2.4 g/dL — ABNORMAL LOW (ref 3.5–5.0)
Alkaline Phosphatase: 95 U/L (ref 38–126)
BILIRUBIN TOTAL: 1.8 mg/dL — AB (ref 0.3–1.2)
BUN: 8 mg/dL (ref 6–20)
CO2: 29 mmol/L (ref 22–32)
Calcium: 7.9 mg/dL — ABNORMAL LOW (ref 8.9–10.3)
Chloride: 96 mmol/L — ABNORMAL LOW (ref 101–111)
Creatinine, Ser: 0.44 mg/dL (ref 0.44–1.00)
Glucose, Bld: 126 mg/dL — ABNORMAL HIGH (ref 65–99)
POTASSIUM: 3 mmol/L — AB (ref 3.5–5.1)
Sodium: 135 mmol/L (ref 135–145)
TOTAL PROTEIN: 6.1 g/dL — AB (ref 6.5–8.1)

## 2017-01-20 LAB — GLUCOSE, CAPILLARY
GLUCOSE-CAPILLARY: 117 mg/dL — AB (ref 65–99)
Glucose-Capillary: 117 mg/dL — ABNORMAL HIGH (ref 65–99)
Glucose-Capillary: 107 mg/dL — ABNORMAL HIGH (ref 65–99)
Glucose-Capillary: 110 mg/dL — ABNORMAL HIGH (ref 65–99)
Glucose-Capillary: 125 mg/dL — ABNORMAL HIGH (ref 65–99)

## 2017-01-20 LAB — CBC
HEMATOCRIT: 32 % — AB (ref 36.0–46.0)
Hemoglobin: 10.7 g/dL — ABNORMAL LOW (ref 12.0–15.0)
MCH: 27.5 pg (ref 26.0–34.0)
MCHC: 33.4 g/dL (ref 30.0–36.0)
MCV: 82.3 fL (ref 78.0–100.0)
Platelets: 193 10*3/uL (ref 150–400)
RBC: 3.89 MIL/uL (ref 3.87–5.11)
RDW: 14.3 % (ref 11.5–15.5)
WBC: 10.2 10*3/uL (ref 4.0–10.5)

## 2017-01-20 LAB — SURGICAL PCR SCREEN
MRSA, PCR: NEGATIVE
Staphylococcus aureus: NEGATIVE

## 2017-01-20 LAB — MAGNESIUM: MAGNESIUM: 2 mg/dL (ref 1.7–2.4)

## 2017-01-20 LAB — LIPASE, BLOOD: LIPASE: 49 U/L (ref 11–51)

## 2017-01-20 SURGERY — LAPAROSCOPIC CHOLECYSTECTOMY WITH INTRAOPERATIVE CHOLANGIOGRAM
Anesthesia: General

## 2017-01-20 MED ORDER — ROCURONIUM BROMIDE 50 MG/5ML IV SOSY
PREFILLED_SYRINGE | INTRAVENOUS | Status: AC
  Filled 2017-01-20: qty 15

## 2017-01-20 MED ORDER — PROPOFOL 10 MG/ML IV BOLUS
INTRAVENOUS | Status: DC | PRN
  Administered 2017-01-20: 150 mg via INTRAVENOUS

## 2017-01-20 MED ORDER — ROCURONIUM BROMIDE 10 MG/ML (PF) SYRINGE
PREFILLED_SYRINGE | INTRAVENOUS | Status: DC | PRN
  Administered 2017-01-20: 50 mg via INTRAVENOUS

## 2017-01-20 MED ORDER — BUPIVACAINE-EPINEPHRINE 0.25% -1:200000 IJ SOLN
INTRAMUSCULAR | Status: DC | PRN
  Administered 2017-01-20: 25 mL

## 2017-01-20 MED ORDER — ALBUMIN HUMAN 5 % IV SOLN
INTRAVENOUS | Status: DC | PRN
Start: 2017-01-20 — End: 2017-01-20
  Administered 2017-01-20: 13:00:00 via INTRAVENOUS

## 2017-01-20 MED ORDER — FENTANYL CITRATE (PF) 100 MCG/2ML IJ SOLN
INTRAMUSCULAR | Status: AC
  Filled 2017-01-20: qty 2

## 2017-01-20 MED ORDER — PIPERACILLIN-TAZOBACTAM 3.375 G IVPB
INTRAVENOUS | Status: AC
Start: 2017-01-20 — End: 2017-01-20
  Filled 2017-01-20: qty 50

## 2017-01-20 MED ORDER — MIDAZOLAM HCL 2 MG/2ML IJ SOLN
INTRAMUSCULAR | Status: AC
  Filled 2017-01-20: qty 2

## 2017-01-20 MED ORDER — BUPIVACAINE-EPINEPHRINE 0.25% -1:200000 IJ SOLN
INTRAMUSCULAR | Status: AC
Start: 2017-01-20 — End: ?
  Filled 2017-01-20: qty 1

## 2017-01-20 MED ORDER — IOPAMIDOL (ISOVUE-300) INJECTION 61%
INTRAVENOUS | Status: AC
  Filled 2017-01-20: qty 50

## 2017-01-20 MED ORDER — FENTANYL CITRATE (PF) 100 MCG/2ML IJ SOLN: 25.0000 ug | INTRAMUSCULAR | Status: DC | PRN

## 2017-01-20 MED ORDER — LIP MEDEX EX OINT: 1.0000 "application " | TOPICAL_OINTMENT | CUTANEOUS | Status: DC | PRN

## 2017-01-20 MED ORDER — LACTATED RINGERS IV SOLN
INTRAVENOUS | Status: AC | PRN
  Administered 2017-01-20: 1000 mL

## 2017-01-20 MED ORDER — DEXAMETHASONE SODIUM PHOSPHATE 10 MG/ML IJ SOLN
INTRAMUSCULAR | Status: DC | PRN
  Administered 2017-01-20: 10 mg via INTRAVENOUS

## 2017-01-20 MED ORDER — 0.9 % SODIUM CHLORIDE (POUR BTL) OPTIME
TOPICAL | Status: DC | PRN
  Administered 2017-01-20: 1000 mL

## 2017-01-20 MED ORDER — ALBUMIN HUMAN 5 % IV SOLN
INTRAVENOUS | Status: AC
  Filled 2017-01-20: qty 250

## 2017-01-20 MED ORDER — PHENYLEPHRINE 40 MCG/ML (10ML) SYRINGE FOR IV PUSH (FOR BLOOD PRESSURE SUPPORT)
PREFILLED_SYRINGE | INTRAVENOUS | Status: DC | PRN
  Administered 2017-01-20 (×5): 80 ug via INTRAVENOUS

## 2017-01-20 MED ORDER — ONDANSETRON HCL 4 MG/2ML IJ SOLN: 4.0000 mg | Freq: Once | INTRAMUSCULAR | Status: DC | PRN

## 2017-01-20 MED ORDER — IOPAMIDOL (ISOVUE-300) INJECTION 61%
INTRAVENOUS | Status: DC | PRN
  Administered 2017-01-20: 10 mL

## 2017-01-20 MED ORDER — SUGAMMADEX SODIUM 200 MG/2ML IV SOLN
INTRAVENOUS | Status: AC
  Filled 2017-01-20: qty 2

## 2017-01-20 MED ORDER — LIDOCAINE 2% (20 MG/ML) 5 ML SYRINGE
INTRAMUSCULAR | Status: AC
  Filled 2017-01-20: qty 5

## 2017-01-20 MED ORDER — SUGAMMADEX SODIUM 500 MG/5ML IV SOLN
INTRAVENOUS | Status: AC
  Filled 2017-01-20: qty 5

## 2017-01-20 MED ORDER — PROPOFOL 10 MG/ML IV BOLUS
INTRAVENOUS | Status: AC
  Filled 2017-01-20: qty 20

## 2017-01-20 MED ORDER — SUGAMMADEX SODIUM 200 MG/2ML IV SOLN
INTRAVENOUS | Status: DC | PRN
  Administered 2017-01-20: 200 mg via INTRAVENOUS

## 2017-01-20 MED ORDER — LIDOCAINE 2% (20 MG/ML) 5 ML SYRINGE
INTRAMUSCULAR | Status: DC | PRN
  Administered 2017-01-20: 100 mg via INTRAVENOUS

## 2017-01-20 MED ORDER — LACTATED RINGERS IV SOLN
INTRAVENOUS | Status: DC
  Administered 2017-01-20: 13:00:00 via INTRAVENOUS

## 2017-01-20 MED ORDER — FENTANYL CITRATE (PF) 250 MCG/5ML IJ SOLN
INTRAMUSCULAR | Status: AC
  Filled 2017-01-20: qty 5

## 2017-01-20 MED ORDER — FENTANYL CITRATE (PF) 100 MCG/2ML IJ SOLN
INTRAMUSCULAR | Status: DC | PRN
  Administered 2017-01-20: 25 ug via INTRAVENOUS

## 2017-01-20 MED ORDER — POTASSIUM CHLORIDE 20 MEQ/15ML (10%) PO SOLN
40.0000 meq | Freq: Once | ORAL | Status: DC
  Filled 2017-01-20: qty 30

## 2017-01-20 SURGICAL SUPPLY — 33 items
APPLIER CLIP ROT 10 11.4 M/L (STAPLE) ×3
BENZOIN TINCTURE PRP APPL 2/3 (GAUZE/BANDAGES/DRESSINGS) ×3 IMPLANT
CABLE HIGH FREQUENCY MONO STRZ (ELECTRODE) ×3 IMPLANT
CHLORAPREP W/TINT 26ML (MISCELLANEOUS) ×3 IMPLANT
CLIP APPLIE ROT 10 11.4 M/L (STAPLE) ×1 IMPLANT
COVER MAYO STAND STRL (DRAPES) ×3 IMPLANT
COVER SURGICAL LIGHT HANDLE (MISCELLANEOUS) ×3 IMPLANT
DECANTER SPIKE VIAL GLASS SM (MISCELLANEOUS) ×3 IMPLANT
DERMABOND ADVANCED (GAUZE/BANDAGES/DRESSINGS) ×2
DERMABOND ADVANCED .7 DNX12 (GAUZE/BANDAGES/DRESSINGS) ×1 IMPLANT
DRAPE C-ARM 42X120 X-RAY (DRAPES) IMPLANT
ELECT REM PT RETURN 15FT ADLT (MISCELLANEOUS) ×3 IMPLANT
GLOVE BIO SURGEON STRL SZ 6 (GLOVE) ×3 IMPLANT
GLOVE INDICATOR 6.5 STRL GRN (GLOVE) ×3 IMPLANT
GOWN STRL REUS W/TWL LRG LVL3 (GOWN DISPOSABLE) ×3 IMPLANT
GOWN STRL REUS W/TWL XL LVL3 (GOWN DISPOSABLE) ×6 IMPLANT
GRASPER SUT TROCAR 14GX15 (MISCELLANEOUS) IMPLANT
HEMOSTAT SNOW SURGICEL 2X4 (HEMOSTASIS) IMPLANT
KIT BASIN OR (CUSTOM PROCEDURE TRAY) ×3 IMPLANT
NEEDLE INSUFFLATION 14GA 120MM (NEEDLE) ×3 IMPLANT
POUCH SPECIMEN RETRIEVAL 10MM (ENDOMECHANICALS) ×3 IMPLANT
SCISSORS LAP 5X35 DISP (ENDOMECHANICALS) ×3 IMPLANT
SET CHOLANGIOGRAPH MIX (MISCELLANEOUS) ×3 IMPLANT
SET IRRIG TUBING LAPAROSCOPIC (IRRIGATION / IRRIGATOR) ×3 IMPLANT
SLEEVE XCEL OPT CAN 5 100 (ENDOMECHANICALS) ×9 IMPLANT
SUT MNCRL AB 4-0 PS2 18 (SUTURE) ×3 IMPLANT
TAPE STRIPS DRAPE STRL (GAUZE/BANDAGES/DRESSINGS) ×6 IMPLANT
TOWEL OR 17X26 10 PK STRL BLUE (TOWEL DISPOSABLE) ×3 IMPLANT
TOWEL OR NON WOVEN STRL DISP B (DISPOSABLE) ×3 IMPLANT
TRAY LAPAROSCOPIC (CUSTOM PROCEDURE TRAY) ×3 IMPLANT
TROCAR BLADELESS OPT 5 100 (ENDOMECHANICALS) ×3 IMPLANT
TROCAR XCEL 12X100 BLDLESS (ENDOMECHANICALS) ×3 IMPLANT
TUBING INSUF HEATED (TUBING) ×3 IMPLANT

## 2017-01-20 NOTE — Progress Notes (Signed)
[facility] Progress Note     Subjective: CC: tired and abdominal pain Patient states that she does still have some abdominal pain but that it is improving. Hurts mostly with movement. She is exhausted and feels like that is exacerbating her pain. She has been tolerating ice chips and nausea is improved. No vomiting.   Objective: Vital signs in last 24 hours: Temp:  [97.7 F (36.5 C)-98.7 F (37.1 C)] 97.8 F (36.6 C) (12/17 0553) Pulse Rate:  [93-101] 97 (12/17 0553) Resp:  [18-26] 18 (12/17 0553) BP: (129-161)/(67-90) 161/83 (12/17 0553) SpO2:  [91 %-97 %] 97 % (12/17 0553)    Intake/Output from previous day: 12/16 0701 - 12/17 0700 In: 1920 [P.O.:720; I.V.:1200] Out: 4000 [Urine:4000] Intake/Output this shift: No intake/output data recorded.  PE: Gen:  Alert, patient appears uncomfortable Card:  Regular rate and rhythm, pedal pulses 2+ BL Pulm:  Normal effort, clear to auscultation bilaterally Abd: Soft, mild diffuse TTP, mildly distended, bowel sounds present, no rebound or guarding Skin: warm and dry, no rashes  Psych: A&Ox3   Lab Results:  Recent Labs    01/19/17 0415 01/20/17 0400  WBC 11.8* 10.2  HGB 11.2* 10.7*  HCT 34.0* 32.0*  PLT 198 193   BMET Recent Labs    01/19/17 0415 01/20/17 0400  NA 140 135  K 3.4* 3.0*  CL 108 96*  CO2 24 29  GLUCOSE 120* 126*  BUN 10 8  CREATININE 0.54 0.44  CALCIUM 7.6* 7.9*   PT/INR No results for input(s): LABPROT, INR in the last 72 hours. CMP     Component Value Date/Time   NA 135 01/20/2017 0400   K 3.0 (L) 01/20/2017 0400   CL 96 (L) 01/20/2017 0400   CO2 29 01/20/2017 0400   GLUCOSE 126 (H) 01/20/2017 0400   BUN 8 01/20/2017 0400   CREATININE 0.44 01/20/2017 0400   CALCIUM 7.9 (L) 01/20/2017 0400   PROT 6.1 (L) 01/20/2017 0400   ALBUMIN 2.4 (L) 01/20/2017 0400   AST 30 01/20/2017 0400   ALT 115 (H) 01/20/2017 0400   ALKPHOS 95 01/20/2017 0400   BILITOT 1.8 (H) 01/20/2017 0400    GFRNONAA >60 01/20/2017 0400   GFRAA >60 01/20/2017 0400   Lipase     Component Value Date/Time   LIPASE 49 01/20/2017 0400     Studies/Results: Dg Chest Port 1 View  Result Date: 01/18/2017 CLINICAL DATA:  Shortness of breath.  Pancreatitis. EXAM: PORTABLE CHEST 1 VIEW COMPARISON:  None. FINDINGS: Cardiomediastinal silhouette is normal. Mediastinal contours appear intact. Low lung volumes. Bilateral pleural effusions with bibasilar subsegmental atelectasis. Osseous structures are without acute abnormality. Soft tissues are grossly normal. IMPRESSION: Low lung volumes. Small to moderate in size bilateral pleural effusions with bibasilar subsegmental atelectasis. Electronically Signed   By: Ted Mcalpineobrinka  Dimitrova M.D.   On: 01/18/2017 14:23   Ct Angio Abd/pel W/ And/or W/o  Result Date: 01/18/2017 CLINICAL DATA:  Acute pancreatitis with MRI demonstrating nonocclusive thrombus in the superior mesenteric vein and poor opacification of the celiac axis and its branches. CTA is performed to evaluate arterial patency and re-evaluate mesenteric venous thrombus. EXAM: CT ANGIOGRAPHY ABDOMEN AND PELVIS WITH CONTRAST AND WITHOUT CONTRAST TECHNIQUE: Multidetector CT imaging of the abdomen and pelvis was performed using the standard protocol during bolus administration of intravenous contrast. Multiplanar reconstructed images and MIPs were obtained and reviewed to evaluate the vascular anatomy. CONTRAST:  100mL ISOVUE-370 IOPAMIDOL (ISOVUE-370) INJECTION 76% COMPARISON:  MRI/MRCP on 01/17/2017 FINDINGS: VASCULAR  Aorta: The abdominal aorta is normally patent and demonstrates normal caliber without evidence of atherosclerosis, aneurysm or stenosis. Celiac: The celiac axis is normally patent including branch vessels. No evidence of arterial occlusion or pseudoaneurysm. Splenic and hepatic artery's are normally patent. The gastroduodenal artery shows normal patency. SMA: The superior mesenteric artery and its  branches are normally patent. Renals: Bilateral single renal artery's are widely patent. IMA: Normally patent. Inflow: Normally patent bilateral iliac artery is. Proximal Outflow: Normally patent bilateral common femoral artery's and femoral bifurcations. Veins: Venous phase imaging again demonstrates similar findings to the MRI study with nonocclusive thrombus present within a focal segment of the main superior mesenteric vein trunk along its right lumen. There is no evidence of portal vein thrombus. The splenic vein is normally patent. IVC, iliac veins and common femoral veins show normal patency. Review of the MIP images confirms the above findings. NON-VASCULAR Lower chest: Bilateral small pleural effusions present with associated bilateral lower lobe atelectasis, right greater than left. Hepatobiliary: Severe steatosis of the liver without evidence of overt cirrhosis. No focal masses identified in the liver. The gallbladder is contracted around multiple large calcified gallstones. No evidence of biliary ductal dilatation. No evidence of choledocholithiasis by CT. Pancreas: Extensive pancreatitis noted with venous phase imaging showing necrosis of approximately 50- 70% of the pancreatic head extending into the neck of the pancreas. The body and tail of the pancreas enhance normally. Extensive inflammation is seen surrounding the pancreas with peripancreatic and retroperitoneal fluid present. Perihepatic and perisplenic fluid also present. Fluid also present in the lesser sac. No focal discrete abscess or pseudocyst identified. No evidence of pancreatic ductal dilatation, focal pancreatic mass or ductal calculi. Spleen: Normal in size without focal abnormality. Adrenals/Urinary Tract: Adrenal glands are unremarkable. Kidneys are normal, without renal calculi, focal lesion, or hydronephrosis. The bladder is decompressed by a Foley catheter. Stomach/Bowel: Bowel shows no evidence of obstruction. No free air. No  significant ileus. Lymphatic: No enlarged lymph nodes identified in the abdomen or pelvis. Reproductive: Uterus and bilateral adnexa are unremarkable. Other: Scattered free fluid in the peritoneal cavity extends into the pelvis. No hernias identified. Musculoskeletal: No acute or significant osseous findings. IMPRESSION: VASCULAR 1. Similar to the MRI, nonocclusive thrombus is identified in a short segment of the main trunk of the superior mesenteric vein. No evidence of portal vein thrombus. 2. No evidence of arterial compromise with normal patency of the mesenteric arteries by CTA. No arterial pseudoaneurysm or hemorrhage identified. NON-VASCULAR 1. Extensive pancreatitis with overt necrosis of the pancreatic head by CT. Approximately 50- 70% of the pancreatic head demonstrates lack of enhancement on venous phase imaging consistent with necrosis. No associated pancreatic abscess or discrete pseudocyst is identified at this time. 2. Cholelithiasis with calcified gallstones in a contracted gallbladder. No imaging findings to suggest acute cholecystitis, choledocholithiasis or biliary obstruction. 3. Severe hepatic steatosis without overt cirrhosis by CT. No hepatic masses identified. Electronically Signed   By: Irish LackGlenn  Yamagata M.D.   On: 01/18/2017 10:29    Anti-infectives: Anti-infectives (From admission, onward)   Start     Dose/Rate Route Frequency Ordered Stop   01/17/17 0400  piperacillin-tazobactam (ZOSYN) IVPB 3.375 g     3.375 g 12.5 mL/hr over 240 Minutes Intravenous Every 8 hours 01/17/17 0052     01/16/17 2130  piperacillin-tazobactam (ZOSYN) IVPB 3.375 g     3.375 g 100 mL/hr over 30 Minutes Intravenous  Once 01/16/17 2119 01/16/17 2158       Assessment/Plan Biliary  pancreatitis, cholelithiasis, elevated LFTs - abdominal pain still present but improving - lipase 49, Tbili 1.8, ALT mildly elevated - to OR today for lap chole with IOC  FEN: NPO, IVF VTE: SCDs ID: IV zosyn  (12/13>>)   LOS: 4 days    Wells Guiles , [facility] 01/20/2017, 7:38 AM Pager: [phone] Consults: [phone] Mon-Fri 7:00 am-4:30 pm Sat-Sun 7:00 am-11:30 am

## 2017-01-20 NOTE — Progress Notes (Signed)
Kristen Sharp 9:24 AM  Subjective: Patient feeling better and her hospital computer chart was reviewed and her case discussed with her brother is well and her case discussed with my partner and she has no new complaints and is going to surgery today  Objective: Vital signs stable afebrile no acute distress abdomen a little discomfort little distended no guarding or rebound liver tests improved hemoglobin slight drop white count okay lipase okay  Assessment: Gallstone pancreatitis  Plan: Will wait on Intra-Op cholangiogram and if positive will plan ERCP Tuesday or Wednesday however if negative we'll be on standby to help when necessary  [facility],Kristen Sharp E  Pager [phone] After 5PM or if no answer call [phone]

## 2017-01-20 NOTE — Anesthesia Preprocedure Evaluation (Addendum)
Anesthesia Evaluation  Patient identified by MRN, date of birth, ID band Patient awake    Reviewed: Allergy & Precautions, NPO status , Patient's Chart, lab work & pertinent test results  Airway Mallampati: III  TM Distance: >3 FB Neck ROM: Full    Dental no notable dental hx.    Pulmonary neg pulmonary ROS,    Pulmonary exam normal breath sounds clear to auscultation       Cardiovascular negative cardio ROS Normal cardiovascular exam Rhythm:Regular Rate:Normal  ECG: NSR, rate 94  Sees cardiologist   Neuro/Psych negative neurological ROS  negative psych ROS   GI/Hepatic Neg liver ROS,   Endo/Other  negative endocrine ROS  Renal/GU negative Renal ROS     Musculoskeletal negative musculoskeletal ROS (+)   Abdominal (+) + obese,   Peds  Hematology  (+) anemia ,   Anesthesia Other Findings gallstones  pancreatitis  Reproductive/Obstetrics                            Anesthesia Physical Anesthesia Plan  ASA: II  Anesthesia Plan: General   Post-op Pain Management:    Induction: Intravenous  PONV Risk Score and Plan: 4 or greater and Dexamethasone, Ondansetron and Treatment may vary due to age or medical condition  Airway Management Planned: Oral ETT  Additional Equipment:   Intra-op Plan:   Post-operative Plan: Extubation in OR  Informed Consent: I have reviewed the patients History and Physical, chart, labs and discussed the procedure including the risks, benefits and alternatives for the proposed anesthesia with the patient or authorized representative who has indicated his/her understanding and acceptance.   Dental advisory given  Plan Discussed with: CRNA  Anesthesia Plan Comments:        Anesthesia Quick Evaluation

## 2017-01-20 NOTE — Anesthesia Procedure Notes (Signed)
Procedure Name: Intubation Date/Time: 01/20/2017 1:00 PM Performed by: Lavina Hamman, CRNA Pre-anesthesia Checklist: Patient identified, Emergency Drugs available, Suction available, Patient being monitored and Timeout performed Patient Re-evaluated:Patient Re-evaluated prior to induction Oxygen Delivery Method: Circle system utilized Preoxygenation: Pre-oxygenation with 100% oxygen Induction Type: IV induction Ventilation: Mask ventilation without difficulty Laryngoscope Size: Mac and 4 Grade View: Grade II Tube type: Oral Tube size: 7.0 mm Number of attempts: 1 Airway Equipment and Method: Stylet Placement Confirmation: ETT inserted through vocal cords under direct vision,  positive ETCO2,  CO2 detector and breath sounds checked- equal and bilateral Secured at: 21 cm Tube secured with: Tape Dental Injury: Teeth and Oropharynx as per pre-operative assessment

## 2017-01-20 NOTE — Progress Notes (Signed)
Pharmacy Antibiotic Note  Kristen Sharp is a 56 y.o. female with nausea, vomiting and abdominal pain admitted on 01/16/2017 with acute cholecystitis, gallstone pancreatitis, necrotizing.  Pharmacy has been consulted for zosyn dosing.  Plan: Zosyn 3.375g IV q8h (4 hour infusion). Afeb, WBC now wnl, SCr wnl Lap Cholecystectomy 12/17, intra-op cholangiogram, if positive ERCP Tues/Wed   Height: 5' (152.4 cm) Weight: 162 lb (73.5 kg) IBW/kg (Calculated) : 45.5  Temp (24hrs), Avg:98.1 F (36.7 C), Min:97.7 F (36.5 C), Max:98.7 F (37.1 C)  Recent Labs  Lab 01/16/17 1925 01/18/17 0425 01/19/17 0415 01/20/17 0400  WBC 11.0* 12.5* 11.8* 10.2  CREATININE 1.00 0.73 0.54 0.44    Estimated Creatinine Clearance: 69.4 mL/min (by C-G formula based on SCr of 0.44 mg/dL).    No Known Allergies  Antimicrobials this admission: 12/13 zosyn >>   Microbiology results: 12/14 BCx: ngtd   Thank you for allowing pharmacy to be a part of this patient's care.  Otho BellowsGreen, Tambi Thole L 01/20/2017 10:05 AM

## 2017-01-20 NOTE — Op Note (Addendum)
Operative Note  Traci Sermonndra Steichen 56 y.o. female [id number]  01/20/2017  Surgeon: Berna Buehelsea A Jonasia Coiner MD  Assistant: OR staff  Procedure performed: Laparoscopic Cholecystectomy, intraoperative cholangiogram  Preop diagnosis: gallstone pancreatitis Post-op diagnosis/intraop findings: same, along with acute cholecystitis  Specimens: gallbladder, peritoneum  EBL: 50cc  Complications: none  Description of procedure: After obtaining informed consent the patient was brought to the operating room. Prophylactic antibiotics and subcutaneous heparin were administered. SCD's were applied. General endotracheal anesthesia was initiated and a formal time-out was performed. The abdomen was prepped and draped in the usual sterile fashion and the abdomen was entered using visiport technique in the left upper quadrant after instilling the site with local. Insufflation to 15mmHg was obtained and gross inspection revealed no evidence of injury from our entry or other intraabdominal abnormalities. Three 5mm trocars were introduced in the supraumbilical, right midclavicular and right anterior axillary lines under direct visualization and following infiltration with local. The entry port was upsized to 11mm trocar. The abdomen was surveyed and she was noted to have numerous small white implants on the omentum and peritoneum as well as blood-tinged ascites, both likely secondary to known necrotizing/hemorrhagic pancreatitis. Her bowel was dilated. The gallbladder was retracted cephalad and the infundibulum was retracted laterally. It was noted to be contracted and had a thickened peritoneum. A combination of hook electrocautery and blunt dissection was utilized to clear the peritoneum from the neck and cystic duct, circumferentially isolating the cystic artery and cystic duct and lifting the gallbladder from the cystic plate. The critical view of safety was achieved with the cystic artery, cystic duct, and liver bed visualized  between them with no other structures. The artery was clipped with a single clip proximally and distally and divided. Another small branch going directly to the cystic duct was clipped so that I could pull it off the duct for better exposure. The cystic duct was cleared off and a clip placed distally. A ductotomy was made sharply, and a small friable yellow stone was milked out of the cystic duct and extracted. The cholangiogram catheter was inserted into the proximal cystic duct and secured with a clip. Cholangiogram was then performed showing normal ductal anatomy and emptying into the duodenum; final interpretation pending. The catheter was removed and three clips were placed on the proximal cystic duct before dividing it. The gallbladder was dissected from the liver plate using electrocautery. Once freed the gallbladder was placed in an endocatch bag and removed through the epigastric trocar site. A small amount of bleeding on the liver bed was controlled with cautery. Some bile had been spilled from the gallbladder during its dissection from the liver bed. This was aspirated and the right upper quadrant was irrigated copiously until the effluent was clear. The ascites noted at the beginning of the case continued to well up throughout the case. I elected to biopsy one of the white peritoneal nodules and sent for routine pathology. Hemostasis was once again confirmed, and reinspection of the abdomen revealed no injuries. The clips were well opposed without any bile leak from the duct or the liver bed. The 11mm trocar site in the epigastrium was closed with two interrupted 0 vicryls in the fascia under direct visualization using a PMI device. The abdomen was desufflated and all trocars removed. The skin incisions were closed with running subcuticular monocryl; benzoin, steri strips and bandaids were applied. The patient was awakened, extubated and transported to the recovery room in stable condition.   All  counts were  correct at the completion of the case.   ADDENDUM: Radiology impression states "Intraoperative cholangiogram demonstrates extrahepatic biliary ducts of unremarkable caliber, with a rounded mobile filling defect within the common bile duct, potentially an air bubble, however, debris/ sludge not excluded. Free flow of contrast across the ampulla."

## 2017-01-20 NOTE — Transfer of Care (Signed)
Immediate Anesthesia Transfer of Care Note  Patient: Kristen Sharp  Procedure(s) Performed: LAPAROSCOPIC CHOLECYSTECTOMY WITH INTRAOPERATIVE CHOLANGIOGRAM (N/A )  Patient Location: PACU  Anesthesia Type:General  Level of Consciousness: awake, alert  and oriented  Airway & Oxygen Therapy: Patient Spontanous Breathing and Patient connected to face mask oxygen  Post-op Assessment: Report given to RN  Post vital signs: Reviewed and stable  Last Vitals:  Vitals:   01/20/17 1012 01/20/17 1034  BP: (!) 170/78 (!) 172/80  Pulse: 97 86  Resp: 20 (!) 24  Temp: 36.8 C 36.8 C  SpO2: 97% 98%    Last Pain:  Vitals:   01/20/17 1034  TempSrc: Oral  PainSc:       Patients Stated Pain Goal: 0 (01/20/17 0816)  Complications: No apparent anesthesia complications

## 2017-01-20 NOTE — Progress Notes (Signed)
PROGRESS NOTE  Kristen Sharp [id number] DOB: 1960-08-25 DOA: 01/16/2017 PCP: Patient, No Pcp Per  HPI/Recap of past 24 hours:  Patient is seen after returned from OR, she is drowsy reports less abdominal pain, no vomiting, no bm  she denies sob   Assessment/Plan: Principal Problem:   Gallstone pancreatitis Active Problems:   Cholecystitis   Cholelithiasis   Abnormal liver function   Hyperglycemia   Abdominal pain   DM (diabetes mellitus) (HCC)  Nacrotizing hemorrhagic pancreatitis/gallstone pancreatitis: -MRI: Extensive pancreatitis noted with venous phase imaging showing necrosis of approximately 50- 70% of the pancreatic head  -nonocclusive thrombus is identified in a short segment of the main trunk of the superior mesenteric vein. No evidence of portal vein thrombus. -lipase on presentation is 1907, trending down --she is started on Dilaudid pca pump for pain control, ivf, abx -s/p Laparoscopic Cholecystectomy, intraoperative cholangiogram on 12/17 -Will follow general surgery and GI recommendations.  Sob: cxr with pleural effusion,  trial dose of iv lasix 20mg x1 on 12/15, lasix 40mg  x1 on 12/16 continue nebs Monitor volume status   Hypokalemia: continue replace k, check  Mag  H/o HLD; stopped taking statin three weeks ago due to travelling out of town. She is from [place]  Body mass index is 31.64 kg/m.  Code Status: full  Family Communication: patient   Disposition Plan: not ready to discharge, diet advancement per general surgery   Consultants:  GI  General surgery  Procedures: Laparoscopic Cholecystectomy, intraoperative cholangiogram   Antibiotics:  Zosyn from admission   Objective: BP (!) 161/83 (BP Location: Left Arm)   Pulse 97   Temp 97.8 F (36.6 C) (Oral)   Resp 18   Ht 5' (1.524 m)   Wt 73.5 kg (162 lb)   SpO2 97%   BMI 31.64 kg/m   Intake/Output Summary (Last 24 hours) at 01/20/2017 0821 Last data filed at 01/20/2017  0555 Gross per 24 hour  Intake 1920 ml  Output 4000 ml  Net -2080 ml   Filed Weights   01/16/17 1826  Weight: 73.5 kg (162 lb)    Exam: Patient is examined daily including today on 01/20/2017, exams remain the same as of yesterday except that has changed    General:  drowsy  Cardiovascular:  sinus tachycardia has resolved, now RRR  Respiratory: diminished, no wheezing today  Abdomen: post op changes, mild RUQ tender, decreased BS  Musculoskeletal: No Edema  Neuro: drowsy, oriented   Data Reviewed: Basic Metabolic Panel: Recent Labs  Lab 01/16/17 1925 01/18/17 0425 01/19/17 0415 01/20/17 0400  NA 137 143 140 135  K 3.8 3.4* 3.4* 3.0*  CL 102 117* 108 96*  CO2 20* 21* 24 29  GLUCOSE 239* 122* 120* 126*  BUN 20 11 10 8   CREATININE 1.00 0.73 0.54 0.44  CALCIUM 9.6 7.1* 7.6* 7.9*  MG  --  1.7  --  2.0   Liver Function Tests: Recent Labs  Lab 01/16/17 1925 01/18/17 0425 01/19/17 0415 01/20/17 0400  AST 953* 104* 52* 30  ALT 871* 249* 157* 115*  ALKPHOS 157* 84 78 95  BILITOT 2.6* 2.1* 2.1* 1.8*  PROT 8.5* 5.5* 5.8* 6.1*  ALBUMIN 4.7 2.6* 2.4* 2.4*   Recent Labs  Lab 01/16/17 1925 01/18/17 0425 01/19/17 0415 01/20/17 0400  LIPASE 1,907* 898* 142* 49   No results for input(s): AMMONIA in the last 168 hours. CBC: Recent Labs  Lab 01/16/17 1925 01/18/17 0425 01/19/17 0415 01/20/17 0400  WBC 11.0* 12.5* 11.8*  10.2  NEUTROABS 10.1*  --  9.8*  --   HGB 17.6* 13.1 11.2* 10.7*  HCT 51.5* 40.1 34.0* 32.0*  MCV 80.0 84.2 83.7 82.3  PLT 332 198 198 193   Cardiac Enzymes:   No results for input(s): CKTOTAL, CKMB, CKMBINDEX, TROPONINI in the last 168 hours. BNP (last 3 results) No results for input(s): BNP in the last 8760 hours.  ProBNP (last 3 results) No results for input(s): PROBNP in the last 8760 hours.  CBG: Recent Labs  Lab 01/19/17 0751 01/19/17 1146 01/19/17 1714 01/19/17 2114 01/20/17 0729  GLUCAP 117* 167* 154* 130* 117*     Recent Results (from the past 240 hour(s))  Culture, blood (Routine X 2) w Reflex to ID Panel     Status: None (Preliminary result)   Collection Time: 01/17/17  1:10 AM  Result Value Ref Range Status   Specimen Description BLOOD LEFT ARM  Final   Special Requests   Final    BOTTLES DRAWN AEROBIC ONLY Blood Culture adequate volume   Culture   Final    NO GROWTH 2 DAYS Performed at [facility], [address] [place], [place] [address]    Report Status PENDING  Incomplete  Culture, blood (Routine X 2) w Reflex to ID Panel     Status: None (Preliminary result)   Collection Time: 01/17/17  1:10 AM  Result Value Ref Range Status   Specimen Description BLOOD RIGHT HAND  Final   Special Requests   Final    BOTTLES DRAWN AEROBIC AND ANAEROBIC Blood Culture adequate volume   Culture   Final    NO GROWTH 2 DAYS Performed at [facility], [address] [place], [place] [address]    Report Status PENDING  Incomplete     Studies: No results found.  Scheduled Meds: . HYDROmorphone   Intravenous Q4H  . insulin aspart  0-5 Units Subcutaneous QHS  . insulin aspart  0-9 Units Subcutaneous TID WC  . lip balm  1 application Topical BID  . mouth rinse  15 mL Mouth Rinse BID  . potassium chloride  40 mEq Oral Once  . saccharomyces boulardii  250 mg Oral BID    Continuous Infusions: . famotidine (PEPCID) IV Stopped (01/19/17 2210)  . lactated ringers 150 mL/hr at 01/19/17 1400  . piperacillin-tazobactam (ZOSYN)  IV 3.375 g (01/20/17 0617)     Time spent: 25mins I have personally reviewed and interpreted on  01/20/2017 daily labs, tele strips, imagings as discussed above under date review session and assessment and plans.  I reviewed all nursing notes, pharmacy notes, consultant notes,  vitals, pertinent old records  I have discussed plan of care as described above with RN , patient  on 01/20/2017   Albertine GratesFang Dae Highley MD, PhD  [facility] Pager [phone]. If 7PM-7AM,  please contact night-coverage at [email or web address], password [email or web address] 01/20/2017, 8:21 AM  LOS: 4 days

## 2017-01-21 ENCOUNTER — Encounter (HOSPITAL_COMMUNITY): Payer: Self-pay | Admitting: Surgery

## 2017-01-21 LAB — COMPREHENSIVE METABOLIC PANEL
ALK PHOS: 225 U/L — AB (ref 38–126)
ALT: 160 U/L — AB (ref 14–54)
AST: 97 U/L — AB (ref 15–41)
Albumin: 2.7 g/dL — ABNORMAL LOW (ref 3.5–5.0)
Anion gap: 11 (ref 5–15)
BUN: 14 mg/dL (ref 6–20)
CALCIUM: 8.3 mg/dL — AB (ref 8.9–10.3)
CHLORIDE: 102 mmol/L (ref 101–111)
CO2: 24 mmol/L (ref 22–32)
CREATININE: 0.48 mg/dL (ref 0.44–1.00)
GFR calc Af Amer: >60 mL/min (ref >60)
Glucose, Bld: 147 mg/dL — ABNORMAL HIGH (ref 65–99)
Potassium: 3 mmol/L — ABNORMAL LOW (ref 3.5–5.1)
Sodium: 137 mmol/L (ref 135–145)
Total Bilirubin: 1.9 mg/dL — ABNORMAL HIGH (ref 0.3–1.2)
Total Protein: 6.5 g/dL (ref 6.5–8.1)

## 2017-01-21 LAB — CBC
HEMATOCRIT: 31 % — AB (ref 36.0–46.0)
HEMOGLOBIN: 10.5 g/dL — AB (ref 12.0–15.0)
MCH: 27.4 pg (ref 26.0–34.0)
MCHC: 33.9 g/dL (ref 30.0–36.0)
MCV: 80.9 fL (ref 78.0–100.0)
PLATELETS: 247 10*3/uL (ref 150–400)
RBC: 3.83 MIL/uL — AB (ref 3.87–5.11)
RDW: 14.4 % (ref 11.5–15.5)
WBC: 13.7 10*3/uL — AB (ref 4.0–10.5)

## 2017-01-21 LAB — GLUCOSE, CAPILLARY
GLUCOSE-CAPILLARY: 158 mg/dL — AB (ref 65–99)
Glucose-Capillary: 137 mg/dL — ABNORMAL HIGH (ref 65–99)
Glucose-Capillary: 124 mg/dL — ABNORMAL HIGH (ref 65–99)
Glucose-Capillary: 119 mg/dL — ABNORMAL HIGH (ref 65–99)

## 2017-01-21 LAB — LIPASE, BLOOD: LIPASE: 35 U/L (ref 11–51)

## 2017-01-21 MED ORDER — HYDROMORPHONE HCL 1 MG/ML IJ SOLN
0.5000 mg | INTRAMUSCULAR | Status: DC | PRN
  Administered 2017-01-21 – 2017-01-22 (×7): 0.5 mg via INTRAVENOUS
  Filled 2017-01-21 (×7): qty 0.5

## 2017-01-21 MED ORDER — HYDROMORPHONE HCL 1 MG/ML IJ SOLN
0.5000 mg | INTRAMUSCULAR | Status: DC | PRN
  Administered 2017-01-21: 0.5 mg via INTRAVENOUS
  Filled 2017-01-21: qty 0.5

## 2017-01-21 MED ORDER — POTASSIUM CHLORIDE 10 MEQ/100ML IV SOLN
10.0000 meq | INTRAVENOUS | Status: AC
  Administered 2017-01-21 (×6): 10 meq via INTRAVENOUS
  Filled 2017-01-21 (×6): qty 100

## 2017-01-21 MED ORDER — HYDROMORPHONE HCL 1 MG/ML IJ SOLN
1.0000 mg | Freq: Once | INTRAMUSCULAR | Status: AC
  Administered 2017-01-21: 1 mg via INTRAVENOUS
  Filled 2017-01-21: qty 1

## 2017-01-21 NOTE — Progress Notes (Signed)
PCA DC'd. Dilaudid 12.245ml wasted in medication room with RN Adela LankJacqueline Thigpen. Lina SarBeth Granvil Djordjevic, RN

## 2017-01-21 NOTE — Progress Notes (Signed)
  1 Day Post-Op   Subjective/Chief Complaint: Feels better than yesterday, pain improved   Objective: Vital signs in last 24 hours: Temp:  [98 F (36.7 C)-99.4 F (37.4 C)] 98.5 F (36.9 C) (12/18 0955) Pulse Rate:  [73-96] 73 (12/18 0955) Resp:  [16-24] 16 (12/18 0955) BP: (136-176)/(55-82) 148/69 (12/18 0955) SpO2:  [92 %-100 %] 98 % (12/18 0955) FiO2 (%):  [37 %] 37 % (12/18 0400) Last BM Date: 01/16/17  Intake/Output from previous day: 12/17 0701 - 12/18 0700 In: 4540 [P.O.:240; I.V.:1600; IV Piggyback:1500] Out: 3500 [Urine:3450; Blood:50] Intake/Output this shift: Total I/O In: 0  Out: 375 [Urine:375]  General appearance: alert and cooperative Resp: clear to auscultation bilaterally GI: soft, mildly distended, minimally tender at incisions Incision/Wound: c/d/i  Lab Results:  Recent Labs    01/20/17 0400 01/21/17 0407  WBC 10.2 13.7*  HGB 10.7* 10.5*  HCT 32.0* 31.0*  PLT 193 247   BMET Recent Labs    01/20/17 0400 01/21/17 0407  NA 135 137  K 3.0* 3.0*  CL 96* 102  CO2 29 24  GLUCOSE 126* 147*  BUN 8 14  CREATININE 0.44 0.48  CALCIUM 7.9* 8.3*   PT/INR No results for input(s): LABPROT, INR in the last 72 hours. ABG No results for input(s): PHART, HCO3 in the last 72 hours.  Invalid input(s): PCO2, PO2  Studies/Results: Dg Cholangiogram Operative  Result Date: 01/20/2017 CLINICAL DATA:  56 year old female with a history of cholelithiasis EXAM: INTRAOPERATIVE CHOLANGIOGRAM TECHNIQUE: Cholangiographic images from the C-arm fluoroscopic device were submitted for interpretation post-operatively. Please see the procedural report for the amount of contrast and the fluoroscopy time utilized. FLUOROSCOPY TIME:  Fluoroscopy Time:  19 seconds COMPARISON:  CT 01/18/2017 FINDINGS: Surgical instruments project over the upper abdomen. There is cannulation of the cystic duct/gallbladder neck, with antegrade infusion of contrast. Caliber of the extrahepatic  ductal system within normal limits. Rounded filling defect within the common bile duct is mobile. Free flow of contrast across the ampulla. IMPRESSION: Intraoperative cholangiogram demonstrates extrahepatic biliary ducts of unremarkable caliber, with a rounded mobile filling defect within the common bile duct, potentially an air bubble, however, debris/ sludge not excluded. Free flow of contrast across the ampulla. Please refer to the dictated operative report for full details of intraoperative findings and procedure Electronically Signed   By: Gilmer MorJaime  Wagner D.O.   On: 01/20/2017 14:45    Anti-infectives: Anti-infectives (From admission, onward)   Start     Dose/Rate Route Frequency Ordered Stop   01/20/17 1225  piperacillin-tazobactam (ZOSYN) 3.375 GM/50ML IVPB    Comments:  [name], [name]   : cabinet override      01/20/17 1225 01/20/17 1300   01/17/17 0400  piperacillin-tazobactam (ZOSYN) IVPB 3.375 g     3.375 g 12.5 mL/hr over 240 Minutes Intravenous Every 8 hours 01/17/17 0052     01/16/17 2130  piperacillin-tazobactam (ZOSYN) IVPB 3.375 g     3.375 g 100 mL/hr over 30 Minutes Intravenous  Once 01/16/17 2119 01/16/17 2158      Assessment/Plan: s/p Procedure(s): LAPAROSCOPIC CHOLECYSTECTOMY WITH INTRAOPERATIVE CHOLANGIOGRAM (N/A) IOC did show a defect in CBD. Her labs went up just a little today which may be post-op. Recheck labs in AM and if increasing would be more inclined for her to have ercp before dc. clears today, NPO mn  LOS: 5 days    Kristen Sharp 01/21/2017

## 2017-01-21 NOTE — Progress Notes (Signed)
PROGRESS NOTE  Traci Sermonndra Cooprider ZOX:[id number] DOB: 07/22/1960 DOA: 01/16/2017 PCP: Patient, No Pcp Per  HPI/Recap of past 24 hours:  Post op day one, feeling better, ab pain has much improved, she has not  Need to push the pca pump very often today She is sitting up in chair, no fever, no n/v. No sob   Assessment/Plan: Principal Problem:   Gallstone pancreatitis Active Problems:   Cholecystitis   Cholelithiasis   Abnormal liver function   Hyperglycemia   Abdominal pain   DM (diabetes mellitus) (HCC)  Severe Nacrotizing hemorrhagic pancreatitis/gallstone pancreatitis: -MRI: Extensive pancreatitis noted with venous phase imaging showing necrosis of approximately 50- 70% of the pancreatic head  -nonocclusive thrombus is identified in a short segment of the main trunk of the superior mesenteric vein. No evidence of portal vein thrombus. -lipase on presentation is 1907, trending down --she is started on Dilaudid pca pump for pain control, and she is started on zosyn since admission -s/p Laparoscopic Cholecystectomy, intraoperative cholangiogram on 12/17,  -she is much better post op, d/c dilaudid pca, change to iv dilaudid prn, - there is a concern of cbd air bubble/cbd sludge by IOC. May need post op ERCP -Will follow general surgery and GI recommendations.  Sob: cxr with pleural effusion,  trial dose of iv lasix 20mg x1 on 12/15, lasix 40mg  x1 on 12/16 continue nebs, now off ivf, better Monitor volume status   Hypokalemia: continue replace k,   Mag 2.  H/o HLD; stopped taking statin three weeks ago due to travelling out of town. She is from [place]  Body mass index is 31.64 kg/m.  Code Status: full  Family Communication: patient at bedside, brother over the phone at [phone].  Disposition Plan: not ready to discharge, may need ERCP, need general surgery and gi clearance for discharge.   Consultants:  GI  General surgery  Procedures: Laparoscopic  Cholecystectomy, intraoperative cholangiogram   Antibiotics:  Zosyn from admission   Objective: BP (!) 159/55 (BP Location: Left Wrist)   Pulse 82   Temp 98 F (36.7 C) (Oral)   Resp 20   Ht 5' (1.524 m)   Wt 73.5 kg (162 lb)   SpO2 100%   BMI 31.64 kg/m   Intake/Output Summary (Last 24 hours) at 01/21/2017 0827 Last data filed at 01/21/2017 0600 Gross per 24 hour  Intake 4540 ml  Output 3500 ml  Net 1040 ml   Filed Weights   01/16/17 1826  Weight: 73.5 kg (162 lb)    Exam: Patient is examined daily including today on 01/21/2017, exams remain the same as of yesterday except that has changed    General:  NAD, sitting up in chair  Cardiovascular:  sinus tachycardia has resolved, now RRR  Respiratory: diminished, no wheezing today  Abdomen: post op changes, mild RUQ tender, decreased BS  Musculoskeletal: No Edema  Neuro: oriented x3  Data Reviewed: Basic Metabolic Panel: Recent Labs  Lab 01/16/17 1925 01/18/17 0425 01/19/17 0415 01/20/17 0400 01/21/17 0407  NA 137 143 140 135 137  K 3.8 3.4* 3.4* 3.0* 3.0*  CL 102 117* 108 96* 102  CO2 20* 21* 24 29 24   GLUCOSE 239* 122* 120* 126* 147*  BUN 20 11 10 8 14   CREATININE 1.00 0.73 0.54 0.44 0.48  CALCIUM 9.6 7.1* 7.6* 7.9* 8.3*  MG  --  1.7  --  2.0  --    Liver Function Tests: Recent Labs  Lab 01/16/17 1925 01/18/17 0425 01/19/17  0415 01/20/17 0400 01/21/17 0407  AST 953* 104* 52* 30 97*  ALT 871* 249* 157* 115* 160*  ALKPHOS 157* 84 78 95 225*  BILITOT 2.6* 2.1* 2.1* 1.8* 1.9*  PROT 8.5* 5.5* 5.8* 6.1* 6.5  ALBUMIN 4.7 2.6* 2.4* 2.4* 2.7*   Recent Labs  Lab 01/16/17 1925 01/18/17 0425 01/19/17 0415 01/20/17 0400 01/21/17 0407  LIPASE 1,907* 898* 142* 49 35   No results for input(s): AMMONIA in the last 168 hours. CBC: Recent Labs  Lab 01/16/17 1925 01/18/17 0425 01/19/17 0415 01/20/17 0400 01/21/17 0407  WBC 11.0* 12.5* 11.8* 10.2 13.7*  NEUTROABS 10.1*  --  9.8*  --   --    HGB 17.6* 13.1 11.2* 10.7* 10.5*  HCT 51.5* 40.1 34.0* 32.0* 31.0*  MCV 80.0 84.2 83.7 82.3 80.9  PLT 332 198 198 193 247   Cardiac Enzymes:   No results for input(s): CKTOTAL, CKMB, CKMBINDEX, TROPONINI in the last 168 hours. BNP (last 3 results) No results for input(s): BNP in the last 8760 hours.  ProBNP (last 3 results) No results for input(s): PROBNP in the last 8760 hours.  CBG: Recent Labs  Lab 01/20/17 1228 01/20/17 1530 01/20/17 1627 01/20/17 2124 01/21/17 0729  GLUCAP 107* 117* 110* 125* 137*    Recent Results (from the past 240 hour(s))  Culture, blood (Routine X 2) w Reflex to ID Panel     Status: None (Preliminary result)   Collection Time: 01/17/17  1:10 AM  Result Value Ref Range Status   Specimen Description BLOOD LEFT ARM  Final   Special Requests   Final    BOTTLES DRAWN AEROBIC ONLY Blood Culture adequate volume   Culture   Final    NO GROWTH 3 DAYS Performed at [facility], [address] [place], [place] [address]    Report Status PENDING  Incomplete  Culture, blood (Routine X 2) w Reflex to ID Panel     Status: None (Preliminary result)   Collection Time: 01/17/17  1:10 AM  Result Value Ref Range Status   Specimen Description BLOOD RIGHT HAND  Final   Special Requests   Final    BOTTLES DRAWN AEROBIC AND ANAEROBIC Blood Culture adequate volume   Culture   Final    NO GROWTH 3 DAYS Performed at [facility], [address] [place], [place] [address]    Report Status PENDING  Incomplete  Surgical pcr screen     Status: None   Collection Time: 01/20/17 10:16 AM  Result Value Ref Range Status   MRSA, PCR NEGATIVE NEGATIVE Final   Staphylococcus aureus NEGATIVE NEGATIVE Final    Comment: (NOTE) The Xpert SA Assay (FDA approved for NASAL specimens in patients 58 years of age and older), is one component of a comprehensive surveillance program. It is not intended to diagnose infection nor to guide or monitor treatment.       Studies: Dg Cholangiogram Operative  Result Date: 01/20/2017 CLINICAL DATA:  56 year old female with a history of cholelithiasis EXAM: INTRAOPERATIVE CHOLANGIOGRAM TECHNIQUE: Cholangiographic images from the C-arm fluoroscopic device were submitted for interpretation post-operatively. Please see the procedural report for the amount of contrast and the fluoroscopy time utilized. FLUOROSCOPY TIME:  Fluoroscopy Time:  19 seconds COMPARISON:  CT 01/18/2017 FINDINGS: Surgical instruments project over the upper abdomen. There is cannulation of the cystic duct/gallbladder neck, with antegrade infusion of contrast. Caliber of the extrahepatic ductal system within normal limits. Rounded filling defect within the common bile  duct is mobile. Free flow of contrast across the ampulla. IMPRESSION: Intraoperative cholangiogram demonstrates extrahepatic biliary ducts of unremarkable caliber, with a rounded mobile filling defect within the common bile duct, potentially an air bubble, however, debris/ sludge not excluded. Free flow of contrast across the ampulla. Please refer to the dictated operative report for full details of intraoperative findings and procedure Electronically Signed   By: Gilmer Mor D.O.   On: 01/20/2017 14:45    Scheduled Meds: . HYDROmorphone   Intravenous Q4H  . insulin aspart  0-5 Units Subcutaneous QHS  . insulin aspart  0-9 Units Subcutaneous TID WC  . mouth rinse  15 mL Mouth Rinse BID  . potassium chloride  40 mEq Oral Once  . saccharomyces boulardii  250 mg Oral BID    Continuous Infusions: . famotidine (PEPCID) IV Stopped (01/20/17 2238)  . piperacillin-tazobactam (ZOSYN)  IV 3.375 g (01/21/17 6295)  . potassium chloride       Time spent: I have personally reviewed and interpreted on  01/21/2017 daily labs, tele strips, imagings as discussed above under date review session and assessment and plans.  I reviewed all nursing notes, pharmacy notes, consultant notes,   vitals, pertinent old records  I have discussed plan of care as described above with RN , patient  And brother on 01/21/2017   Albertine Grates MD, PhD  [facility] Pager [phone]. If 7PM-7AM, please contact night-coverage at [email or web address], password [facility] 01/21/2017, 8:27 AM  LOS: 5 days

## 2017-01-21 NOTE — Anesthesia Postprocedure Evaluation (Signed)
Anesthesia Post Note  Patient: Kristen Sharp  Procedure(s) Performed: LAPAROSCOPIC CHOLECYSTECTOMY WITH INTRAOPERATIVE CHOLANGIOGRAM (N/A )     Patient location during evaluation: PACU Anesthesia Type: General Level of consciousness: awake and alert Pain management: pain level controlled Vital Signs Assessment: post-procedure vital signs reviewed and stable Respiratory status: spontaneous breathing, nonlabored ventilation, respiratory function stable and patient connected to nasal cannula oxygen Cardiovascular status: blood pressure returned to baseline and stable Postop Assessment: no apparent nausea or vomiting Anesthetic complications: no    Last Vitals:  Vitals:   01/21/17 1518 01/21/17 1537  BP:    Pulse:    Resp: 20 18  Temp:    SpO2: 100%     Last Pain:  Vitals:   01/21/17 1953  TempSrc:   PainSc: 8                  Kristen Sharp

## 2017-01-21 NOTE — Progress Notes (Signed)
Traci SermonIndra Vasconez 11:12 AM  Subjective: Patient seen and examined and case discussed with hospital team as well as surgical team and she is feeling better and did well with her surgery and we discussed the Intra-Op cholangiogram which I reviewed and she has no new complaints  Objective: Vital signs stable afebrile no acute distress abdomen is a little softer nontender white count increased slightly LFTs all increased slightly Intra-Op cholangiogram with questionable air bubble sludge possible stone  Assessment: Abnormal IOC increasing liver tests  Plan: I offered the patient and ERCP here today versus holding off and the risks benefits methods and success rate of ERCP versus risks of waiting for [place] to proceed was all extensively discussed and agree with letting her eat and repeat labs tomorrow and then deciding how to proceed and a might be best if [place] goes to [place] she have an EUS first just to see if she needs an ERCP which would be higher risk based on her pancreatitis  [facility],Haani Bakula E  Pager [phone] After 5PM or if no answer call [phone]

## 2017-01-22 ENCOUNTER — Encounter (HOSPITAL_COMMUNITY)
Admission: EM | Disposition: A | Discharge status: Home / Self Care | Payer: Self-pay | Source: Home / Self Care | Attending: Internal Medicine

## 2017-01-22 ENCOUNTER — Encounter (HOSPITAL_COMMUNITY): Payer: Self-pay | Admitting: *Deleted

## 2017-01-22 ENCOUNTER — Inpatient Hospital Stay (HOSPITAL_COMMUNITY): Payer: PRIVATE HEALTH INSURANCE

## 2017-01-22 ENCOUNTER — Inpatient Hospital Stay (HOSPITAL_COMMUNITY): Payer: PRIVATE HEALTH INSURANCE | Admitting: Certified Registered Nurse Anesthetist

## 2017-01-22 DIAGNOSIS — K56609 Unspecified intestinal obstruction, unspecified as to partial versus complete obstruction: Secondary | ICD-10-CM

## 2017-01-22 DIAGNOSIS — K8063 Calculus of gallbladder and bile duct with acute cholecystitis with obstruction: Secondary | ICD-10-CM

## 2017-01-22 DIAGNOSIS — K8591 Acute pancreatitis with uninfected necrosis, unspecified: Secondary | ICD-10-CM

## 2017-01-22 HISTORY — PX: ENDOSCOPIC RETROGRADE CHOLANGIOPANCREATOGRAPHY (ERCP) WITH PROPOFOL: SHX5810

## 2017-01-22 LAB — CBC
HCT: 31.9 % — ABNORMAL LOW (ref 36.0–46.0)
Hemoglobin: 10.9 g/dL — ABNORMAL LOW (ref 12.0–15.0)
MCH: 27.3 pg (ref 26.0–34.0)
MCHC: 34.2 g/dL (ref 30.0–36.0)
MCV: 79.8 fL (ref 78.0–100.0)
PLATELETS: 246 10*3/uL (ref 150–400)
RBC: 4 MIL/uL (ref 3.87–5.11)
RDW: 14.4 % (ref 11.5–15.5)
WBC: 13.6 10*3/uL — AB (ref 4.0–10.5)

## 2017-01-22 LAB — COMPREHENSIVE METABOLIC PANEL
ALBUMIN: 2.5 g/dL — AB (ref 3.5–5.0)
ALT: 106 U/L — ABNORMAL HIGH (ref 14–54)
ANION GAP: 10 (ref 5–15)
AST: 35 U/L (ref 15–41)
Alkaline Phosphatase: 184 U/L — ABNORMAL HIGH (ref 38–126)
BUN: 9 mg/dL (ref 6–20)
CHLORIDE: 99 mmol/L — AB (ref 101–111)
CO2: 25 mmol/L (ref 22–32)
Calcium: 7.8 mg/dL — ABNORMAL LOW (ref 8.9–10.3)
Creatinine, Ser: 0.45 mg/dL (ref 0.44–1.00)
GFR calc Af Amer: >60 mL/min (ref >60)
GFR calc non Af Amer: >60 mL/min (ref >60)
GLUCOSE: 154 mg/dL — AB (ref 65–99)
POTASSIUM: 2.7 mmol/L — AB (ref 3.5–5.1)
Sodium: 134 mmol/L — ABNORMAL LOW (ref 135–145)
Total Bilirubin: 2.2 mg/dL — ABNORMAL HIGH (ref 0.3–1.2)
Total Protein: 6.1 g/dL — ABNORMAL LOW (ref 6.5–8.1)

## 2017-01-22 LAB — CULTURE, BLOOD (ROUTINE X 2)
CULTURE: NO GROWTH
Culture: NO GROWTH
SPECIAL REQUESTS: ADEQUATE
Special Requests: ADEQUATE

## 2017-01-22 LAB — GLUCOSE, CAPILLARY
GLUCOSE-CAPILLARY: 134 mg/dL — AB (ref 65–99)
GLUCOSE-CAPILLARY: 114 mg/dL — AB (ref 65–99)
Glucose-Capillary: 115 mg/dL — ABNORMAL HIGH (ref 65–99)
Glucose-Capillary: 94 mg/dL (ref 65–99)
Glucose-Capillary: 208 mg/dL — ABNORMAL HIGH (ref 65–99)

## 2017-01-22 LAB — LIPASE, BLOOD: LIPASE: 43 U/L (ref 11–51)

## 2017-01-22 SURGERY — ENDOSCOPIC RETROGRADE CHOLANGIOPANCREATOGRAPHY (ERCP) WITH PROPOFOL
Anesthesia: General

## 2017-01-22 MED ORDER — LIDOCAINE 2% (20 MG/ML) 5 ML SYRINGE
INTRAMUSCULAR | Status: DC | PRN
  Administered 2017-01-22: 100 mg via INTRAVENOUS

## 2017-01-22 MED ORDER — HYDROMORPHONE 1 MG/ML IV SOLN: INTRAVENOUS | Status: DC

## 2017-01-22 MED ORDER — DIPHENHYDRAMINE HCL 50 MG/ML IJ SOLN: 12.5000 mg | Freq: Four times a day (QID) | INTRAMUSCULAR | Status: DC | PRN

## 2017-01-22 MED ORDER — DIPHENHYDRAMINE HCL 12.5 MG/5ML PO ELIX: 12.5000 mg | ORAL_SOLUTION | Freq: Four times a day (QID) | ORAL | Status: DC | PRN

## 2017-01-22 MED ORDER — PROMETHAZINE HCL 25 MG/ML IJ SOLN: 6.2500 mg | INTRAMUSCULAR | Status: DC | PRN

## 2017-01-22 MED ORDER — FENTANYL CITRATE (PF) 100 MCG/2ML IJ SOLN
INTRAMUSCULAR | Status: AC
  Filled 2017-01-22: qty 2

## 2017-01-22 MED ORDER — LACTATED RINGERS IV SOLN
INTRAVENOUS | Status: DC | PRN
  Administered 2017-01-22: 16:00:00 via INTRAVENOUS

## 2017-01-22 MED ORDER — SUGAMMADEX SODIUM 500 MG/5ML IV SOLN
INTRAVENOUS | Status: DC | PRN
  Administered 2017-01-22: 300 mg via INTRAVENOUS

## 2017-01-22 MED ORDER — DEXAMETHASONE SODIUM PHOSPHATE 4 MG/ML IJ SOLN
INTRAMUSCULAR | Status: DC | PRN
  Administered 2017-01-22: 10 mg via INTRAVENOUS

## 2017-01-22 MED ORDER — FENTANYL CITRATE (PF) 100 MCG/2ML IJ SOLN: 25.0000 ug | INTRAMUSCULAR | Status: DC | PRN

## 2017-01-22 MED ORDER — HYDROMORPHONE HCL 1 MG/ML IJ SOLN
2.0000 mg | INTRAMUSCULAR | Status: DC | PRN
  Administered 2017-01-22 (×3): 2 mg via INTRAVENOUS
  Filled 2017-01-22 (×3): qty 2

## 2017-01-22 MED ORDER — INDOMETHACIN 50 MG RE SUPP
RECTAL | Status: AC
  Filled 2017-01-22: qty 2

## 2017-01-22 MED ORDER — ONDANSETRON HCL 4 MG/2ML IJ SOLN
INTRAMUSCULAR | Status: DC | PRN
  Administered 2017-01-22: 4 mg via INTRAVENOUS

## 2017-01-22 MED ORDER — NALOXONE HCL 0.4 MG/ML IJ SOLN: 0.4000 mg | INTRAMUSCULAR | Status: DC | PRN

## 2017-01-22 MED ORDER — SODIUM CHLORIDE 0.9% FLUSH: 9.0000 mL | INTRAVENOUS | Status: DC | PRN

## 2017-01-22 MED ORDER — ONDANSETRON HCL 4 MG/2ML IJ SOLN: 4.0000 mg | Freq: Four times a day (QID) | INTRAMUSCULAR | Status: DC | PRN

## 2017-01-22 MED ORDER — HYDROMORPHONE HCL 1 MG/ML IJ SOLN
1.0000 mg | INTRAMUSCULAR | Status: DC | PRN
  Administered 2017-01-22: 1 mg via INTRAVENOUS
  Filled 2017-01-22: qty 1

## 2017-01-22 MED ORDER — PHENYLEPHRINE 40 MCG/ML (10ML) SYRINGE FOR IV PUSH (FOR BLOOD PRESSURE SUPPORT)
PREFILLED_SYRINGE | INTRAVENOUS | Status: DC | PRN
  Administered 2017-01-22: 80 ug via INTRAVENOUS
  Administered 2017-01-22: 160 ug via INTRAVENOUS
  Administered 2017-01-22 (×2): 80 ug via INTRAVENOUS

## 2017-01-22 MED ORDER — SODIUM CHLORIDE 0.9 % IV SOLN: INTRAVENOUS | Status: DC

## 2017-01-22 MED ORDER — POTASSIUM CHLORIDE 2 MEQ/ML IV SOLN
INTRAVENOUS | Status: DC
  Administered 2017-01-22 – 2017-01-23 (×2): via INTRAVENOUS
  Filled 2017-01-22 (×7): qty 1000

## 2017-01-22 MED ORDER — ROCURONIUM BROMIDE 50 MG/5ML IV SOSY
PREFILLED_SYRINGE | INTRAVENOUS | Status: DC | PRN
  Administered 2017-01-22: 50 mg via INTRAVENOUS

## 2017-01-22 MED ORDER — PROPOFOL 10 MG/ML IV BOLUS
INTRAVENOUS | Status: DC | PRN
  Administered 2017-01-22: 100 mg via INTRAVENOUS

## 2017-01-22 MED ORDER — POTASSIUM CHLORIDE 10 MEQ/100ML IV SOLN
10.0000 meq | INTRAVENOUS | Status: AC
  Administered 2017-01-22 (×4): 10 meq via INTRAVENOUS
  Filled 2017-01-22 (×4): qty 100

## 2017-01-22 MED ORDER — GLUCAGON HCL RDNA (DIAGNOSTIC) 1 MG IJ SOLR
INTRAMUSCULAR | Status: AC
  Filled 2017-01-22: qty 1

## 2017-01-22 MED ORDER — PROPOFOL 10 MG/ML IV BOLUS
INTRAVENOUS | Status: AC
  Filled 2017-01-22: qty 20

## 2017-01-22 MED ORDER — IOPAMIDOL (ISOVUE-M 300) INJECTION 61%
INTRAMUSCULAR | Status: DC | PRN
  Administered 2017-01-22: 15 mL via INTRATHECAL

## 2017-01-22 MED ORDER — FENTANYL CITRATE (PF) 100 MCG/2ML IJ SOLN
INTRAMUSCULAR | Status: DC | PRN
  Administered 2017-01-22: 50 ug via INTRAVENOUS

## 2017-01-22 NOTE — Anesthesia Procedure Notes (Signed)
Procedure Name: Intubation Date/Time: 01/22/2017 4:16 PM Performed by: Deliah Boston, CRNA Pre-anesthesia Checklist: Patient identified, Emergency Drugs available, Suction available and Patient being monitored Patient Re-evaluated:Patient Re-evaluated prior to induction Oxygen Delivery Method: Circle system utilized Preoxygenation: Pre-oxygenation with 100% oxygen Induction Type: IV induction, Cricoid Pressure applied and Rapid sequence Ventilation: Mask ventilation without difficulty Laryngoscope Size: Mac and 4 Grade View: Grade I Tube type: Oral Tube size: 7.0 mm Number of attempts: 1 Airway Equipment and Method: Stylet and Oral airway Placement Confirmation: ETT inserted through vocal cords under direct vision,  positive ETCO2 and breath sounds checked- equal and bilateral Secured at: 20 cm Tube secured with: Tape Dental Injury: Teeth and Oropharynx as per pre-operative assessment

## 2017-01-22 NOTE — Progress Notes (Addendum)
Traci SermonIndra Shapley 3:45 PM  Subjective: Patient doing better this afternoon and then it sounded like this morning and no new complaints and case discussed with the surgical PA and we discussed possibly a EUS but unfortunately the schedule did not allow that based on endoscopy and operator availability  Objective: Vital signs stable afebrile no acute distress exam is improved from earlier today please see preassessment evaluation bili slight increase,white count unchanged other liver tests decreased  Assessment: Probable CBD stone  Plan: ERCP was rediscussed with the patient and will proceed with anesthesia assistance with further workup and plans pending those findings  [facility],Shahidah Nesbitt E  Pager [phone] After 5PM or if no answer call [phone]

## 2017-01-22 NOTE — Progress Notes (Signed)
Lab called RN and reported patient's potassium to be 2.7. Doctor on call was notified.

## 2017-01-22 NOTE — Anesthesia Procedure Notes (Deleted)
Performed by: Marlis Oldaker C, CRNA       

## 2017-01-22 NOTE — Transfer of Care (Signed)
Immediate Anesthesia Transfer of Care Note  Patient: Kristen Sharp  Procedure(s) Performed: Procedure(s): ENDOSCOPIC RETROGRADE CHOLANGIOPANCREATOGRAPHY (ERCP) WITH PROPOFOL (N/A)  Patient Location: PACU  Anesthesia Type:General  Level of Consciousness: Patient easily awoken, sedated, comfortable, cooperative, following commands, responds to stimulation.   Airway & Oxygen Therapy: Patient spontaneously breathing, ventilating well, oxygen via simple oxygen mask.  Post-op Assessment: Report given to PACU RN, vital signs reviewed and stable, moving all extremities.   Post vital signs: Reviewed and stable.  Complications: No apparent anesthesia complications Last Vitals:  Vitals:   01/22/17 1709 01/22/17 1715  BP: 131/65 136/69  Pulse: 97 100  Resp: 20 17  Temp: 37.4 C   SpO2: 100% 100%    Last Pain:  Vitals:   01/22/17 1709  TempSrc:   PainSc: 0-No pain      Patients Stated Pain Goal: 2 (01/22/17 0618)  Complications: No apparent anesthesia complications

## 2017-01-22 NOTE — Anesthesia Preprocedure Evaluation (Addendum)
Anesthesia Evaluation  Patient identified by MRN, date of birth, ID band Patient awake  General Assessment Comment:Gallstone pancreatitis  Reviewed: Allergy & Precautions, NPO status , Patient's Chart, lab work & pertinent test results  Airway Mallampati: II  TM Distance: >3 FB Neck ROM: Full    Dental no notable dental hx.    Pulmonary neg pulmonary ROS,    Pulmonary exam normal breath sounds clear to auscultation       Cardiovascular negative cardio ROS Normal cardiovascular exam Rhythm:Regular Rate:Normal     Neuro/Psych negative neurological ROS  negative psych ROS   GI/Hepatic negative GI ROS, Neg liver ROS,   Endo/Other  negative endocrine ROS  Renal/GU negative Renal ROS  negative genitourinary   Musculoskeletal negative musculoskeletal ROS (+)   Abdominal   Peds negative pediatric ROS (+)  Hematology negative hematology ROS (+)   Anesthesia Other Findings   Reproductive/Obstetrics negative OB ROS                            Anesthesia Physical Anesthesia Plan  ASA: III  Anesthesia Plan: General   Post-op Pain Management:    Induction: Intravenous  PONV Risk Score and Plan: 3 and Ondansetron, Dexamethasone and Treatment may vary due to age or medical condition  Airway Management Planned: Oral ETT  Additional Equipment:   Intra-op Plan:   Post-operative Plan: Extubation in OR  Informed Consent: I have reviewed the patients History and Physical, chart, labs and discussed the procedure including the risks, benefits and alternatives for the proposed anesthesia with the patient or authorized representative who has indicated his/her understanding and acceptance.   Dental advisory given  Plan Discussed with: CRNA and Surgeon  Anesthesia Plan Comments:        Anesthesia Quick Evaluation

## 2017-01-22 NOTE — Op Note (Signed)
[facility] Patient Name: Kristen Sharp Procedure Date: 01/22/2017 MRN: [id number] Attending MD: Vida RiggerMarc Arriyana Rodell , MD Date of Birth: 10/14/1960 CSN: [id number] Age: 56 Admit Type: Inpatient Procedure:                ERCP Indications:              Suspected bile duct stone(s) abnormal IOC increased                            bilirubin Providers:                Vida RiggerMarc Tiena Manansala, MD, Harold BarbanLiz Honeycutt, RN, Tomma RakersJennifer Kappus,                            RN, Arlee Muslimhris Chandler Tech., Technician, Randon Goldsmithachel Jones,                            CRNA Referring MD:              Medicines:                General Anesthesia Complications:            No immediate complications. Estimated Blood Loss:     Estimated blood loss: none. Procedure:                Pre-Anesthesia Assessment:                           - Prior to the procedure, a History and Physical                            was performed, and patient medications and                            allergies were reviewed. The patient's tolerance of                            previous anesthesia was also reviewed. The risks                            and benefits of the procedure and the sedation                            options and risks were discussed with the patient.                            All questions were answered, and informed consent                            was obtained. Prior Anticoagulants: The patient has                            taken no previous anticoagulant or antiplatelet                            agents. ASA Grade Assessment: II - A  patient with                            mild systemic disease. After reviewing the risks                            and benefits, the patient was deemed in                            satisfactory condition to undergo the procedure.                           After obtaining informed consent, the scope was                            passed under direct vision. Throughout the      procedure, the patient's blood pressure, pulse, and                            oxygen saturations were monitored continuously. The                            [id number] was introduced through the mouth,                            and used to inject contrast into and used to                            cannulate the bile duct. The ERCP was accomplished                            without difficulty. The patient tolerated the                            procedure well. Scope In: Scope Out: Findings:      The major papilla was normal. The upper GI tract was traversed under       direct vision without detailed examination. Localized moderately       erythematous mucosa without active bleeding and with no stigmata of       bleeding was found in the first portion of the duodenum and in the       second portion of the duodenum. Deep selective cannulation was obtained       on the first attempt and there was no pancreatic duct injection or wire       advancement throughout the procedure Biliary sphincterotomy was made       with a Hydratome sphincterotome using ERBE electrocautery. Until we had       adequate biliary drainage and could get a fully bowed sphincterotome       easily in and out of the duct There was no post-sphincterotomy bleeding.       Air bubbles versus tiny stones were visualized in the middle third of       the main bile duct on initial cholangiogram. The biliary tree was swept       with a 9-12 mm balloon starting at the  bifurcation multiple times using       both sizes and both balloons passed readily through the patent       sphincterotomy site. Minimal Debris was swept from the duct. Nothing was       found at the end of the procedure on occlusion cholangiogram. There was       adequate biliary drainage and the scope was removed Impression:               - The major papilla appeared normal.                           - Erythematous duodenopathy.                            - Bubbles versus stones were found in the biliary                            tract on initial cholangiogram.                           - A biliary sphincterotomy was performed.                           - The biliary tree was swept and minimal debris was                            removed initially but nothing was found at the end                            of the procedure on occlusion cholangiogram and the                            duct was swept multiple times without any obvious                            stones. Moderate Sedation:      N/A- Per Anesthesia Care Recommendation:           - Clear liquid diet today. Slowly advance tomorrow                            may need pancreatic enzymes and may be having pain                            from residual pancreatitis                           - Continue present medications. Hopefully she                            should be able to go home soon and can resume care                            at home                           -  Return to GI clinic PRN. Recheck labs tomorrow                           - Telephone GI clinic if symptomatic PRN. Will                            check on tomorrow Procedure Code(s):        --- Professional ---                           [phone], Endoscopic retrograde                            cholangiopancreatography (ERCP); with removal of                            calculi/debris from biliary/pancreatic duct(s)                           43262, Endoscopic retrograde                            cholangiopancreatography (ERCP); with                            sphincterotomy/papillotomy Diagnosis Code(s):        --- Professional ---                           K31.89, Other diseases of stomach and duodenum                           R93.2, Abnormal findings on diagnostic imaging of                            liver and biliary tract CPT copyright 2016 [employer]. All rights reserved. The codes  documented in this report are preliminary and upon coder review may  be revised to meet current compliance requirements. Vida Rigger, MD 01/22/2017 5:03:01 PM This report has been signed electronically. Number of Addenda: 0

## 2017-01-22 NOTE — Progress Notes (Signed)
[facility] Progress Note  2 Days Post-Op  Subjective: CC: abdominal pain Patient with worsened abdominal pain. Pain medication helping minimally. Unable to rest due to pain. UOP good. VSS.   Objective: Vital signs in last 24 hours: Temp:  [97.4 F (36.3 C)-98.5 F (36.9 C)] 97.7 F (36.5 C) (12/19 0542) Pulse Rate:  [73-101] 87 (12/19 0542) Resp:  [16-20] 18 (12/19 0542) BP: (146-158)/(56-91) 146/68 (12/19 0542) SpO2:  [94 %-100 %] 99 % (12/19 0542) Last BM Date: 01/16/17  Intake/Output from previous day: 12/18 0701 - 12/19 0700 In: 520 [P.O.:220; IV Piggyback:300] Out: 2330 [Urine:2330] Intake/Output this shift: No intake/output data recorded.  PE: Gen:  Alert, in obvious discomfort Card:  Regular rate and rhythm, pedal pulses 2+ BL Pulm:  Normal effort, clear to auscultation bilaterally Abd: Soft, diffusely tender, non-distended, bowel sounds hypoactive, no HSM, incisions C/D/I Skin: warm and dry, no rashes  Psych: A&Ox3   Lab Results:  Recent Labs    01/21/17 0407 01/22/17 0507  WBC 13.7* 13.6*  HGB 10.5* 10.9*  HCT 31.0* 31.9*  PLT 247 246   BMET Recent Labs    01/21/17 0407 01/22/17 0507  NA 137 134*  K 3.0* 2.7*  CL 102 99*  CO2 24 25  GLUCOSE 147* 154*  BUN 14 9  CREATININE 0.48 0.45  CALCIUM 8.3* 7.8*   PT/INR No results for input(s): LABPROT, INR in the last 72 hours. CMP     Component Value Date/Time   NA 134 (L) 01/22/2017 0507   K 2.7 (LL) 01/22/2017 0507   CL 99 (L) 01/22/2017 0507   CO2 25 01/22/2017 0507   GLUCOSE 154 (H) 01/22/2017 0507   BUN 9 01/22/2017 0507   CREATININE 0.45 01/22/2017 0507   CALCIUM 7.8 (L) 01/22/2017 0507   PROT 6.1 (L) 01/22/2017 0507   ALBUMIN 2.5 (L) 01/22/2017 0507   AST 35 01/22/2017 0507   ALT 106 (H) 01/22/2017 0507   ALKPHOS 184 (H) 01/22/2017 0507   BILITOT 2.2 (H) 01/22/2017 0507   GFRNONAA >60 01/22/2017 0507   GFRAA >60 01/22/2017 0507   Lipase     Component Value  Date/Time   LIPASE 43 01/22/2017 0507       Studies/Results: Dg Cholangiogram Operative  Result Date: 01/20/2017 CLINICAL DATA:  56 year old female with a history of cholelithiasis EXAM: INTRAOPERATIVE CHOLANGIOGRAM TECHNIQUE: Cholangiographic images from the C-arm fluoroscopic device were submitted for interpretation post-operatively. Please see the procedural report for the amount of contrast and the fluoroscopy time utilized. FLUOROSCOPY TIME:  Fluoroscopy Time:  19 seconds COMPARISON:  CT 01/18/2017 FINDINGS: Surgical instruments project over the upper abdomen. There is cannulation of the cystic duct/gallbladder neck, with antegrade infusion of contrast. Caliber of the extrahepatic ductal system within normal limits. Rounded filling defect within the common bile duct is mobile. Free flow of contrast across the ampulla. IMPRESSION: Intraoperative cholangiogram demonstrates extrahepatic biliary ducts of unremarkable caliber, with a rounded mobile filling defect within the common bile duct, potentially an air bubble, however, debris/ sludge not excluded. Free flow of contrast across the ampulla. Please refer to the dictated operative report for full details of intraoperative findings and procedure Electronically Signed   By: Gilmer MorJaime  Wagner D.O.   On: 01/20/2017 14:45    Anti-infectives: Anti-infectives (From admission, onward)   Start     Dose/Rate Route Frequency Ordered Stop   01/20/17 1225  piperacillin-tazobactam (ZOSYN) 3.375 GM/50ML IVPB    Comments:  [name], [name]   : cabinet override  01/20/17 1225 01/20/17 1300   01/17/17 0400  piperacillin-tazobactam (ZOSYN) IVPB 3.375 g     3.375 g 12.5 mL/hr over 240 Minutes Intravenous Every 8 hours 01/17/17 0052     01/16/17 2130  piperacillin-tazobactam (ZOSYN) IVPB 3.375 g     3.375 g 100 mL/hr over 30 Minutes Intravenous  Once 01/16/17 2119 01/16/17 2158       Assessment/Plan Biliary pancreatitis, cholelithiasis, elevated  LFTs S/P lap chole with IOC 01/20/17 Dr. Fredricka Bonineonnor - POD#2 - IOC showed defect in CBD, bili and lipase trending up slightly from yesterday - have increased dilaudid to 1 mg q2 prn for now - I think patient may need ERCP, will discuss with Dr. Ewing SchleinMagod.  FEN: NPO, IVF VTE: SCDs ID: IV [email or web address] (12/13>12/17)  Plan: Keep NPO, IVF. Will page GI. Patient states she would be willing to do ERCP if able.   LOS: 6 days    Wells GuilesKelly Rayburn , [facility] 01/22/2017, 8:06 AM Pager: [phone] Consults: [phone] Mon-Fri 7:00 am-4:30 pm Sat-Sun 7:00 am-11:30 am

## 2017-01-22 NOTE — Progress Notes (Addendum)
[facility] PROGRESS NOTE    Progress Note  Kristen Sharp  [id number] DOB: 1960-10-14 DOA: 01/16/2017 PCP: Patient, No Pcp Per     Brief Narrative:   Kristen Sermonndra Brinley is an 56 y.o. female without a significant past medical history comes in for acute pancreatitis  Assessment/Plan:   Severe Gallstone pancreatitis/ Necrotizing pancreatitis/cholecystitis Admitted for cholecystitis and gallstone pancreatitis started empirically on IV Zosyn on admission GI was consulted they recommended possible ERCP. MRI showed extensive pancreatitis with necrosis of about 50% of the pancreatic head, nonocclusive thrombus. Gastroenterology was consulted agree with plan recommended i.v. fluid aggressive hydration and recommended general surgery consult for possible lap chole early next week, if intraoperative cholangiogram was positive then they will proceed with ERCP on Wednesday Her pain was controlled with IV Dilaudid general surgery was consulted, who performed a lap chole on 01/20/2017, PCA pump DC'd now on IV Dilaudid as needed. GI was reconsulted and they will perform ERCP on 01/22/2017. LFTs continue to improve.  Shortness of breath: With pleural effusion on chest x-ray she was given a dose of IV Lasix and now is much improved.  Hypokalemia: Continue replete potassium to keep above 4 try to keep mag above 2.  Hyperlipidemia: She stopped taking statin 3 weeks prior to admission.   DVT prophylaxis: lovenox Family Communication:Brother Disposition Plan/Barrier to D/C: unable to determine Code Status:     Code Status Orders  (From admission, onward)        Start     Ordered   01/17/17 0042  Full code  Continuous     01/17/17 0042    Code Status History    Date Active Date Inactive Code Status Order ID Comments User Context   This patient has a current code status but no historical code status.        IV Access:    Peripheral IV   Procedures and diagnostic studies:    Dg Cholangiogram Operative  Result Date: 01/20/2017 CLINICAL DATA:  56 year old female with a history of cholelithiasis EXAM: INTRAOPERATIVE CHOLANGIOGRAM TECHNIQUE: Cholangiographic images from the C-arm fluoroscopic device were submitted for interpretation post-operatively. Please see the procedural report for the amount of contrast and the fluoroscopy time utilized. FLUOROSCOPY TIME:  Fluoroscopy Time:  19 seconds COMPARISON:  CT 01/18/2017 FINDINGS: Surgical instruments project over the upper abdomen. There is cannulation of the cystic duct/gallbladder neck, with antegrade infusion of contrast. Caliber of the extrahepatic ductal system within normal limits. Rounded filling defect within the common bile duct is mobile. Free flow of contrast across the ampulla. IMPRESSION: Intraoperative cholangiogram demonstrates extrahepatic biliary ducts of unremarkable caliber, with a rounded mobile filling defect within the common bile duct, potentially an air bubble, however, debris/ sludge not excluded. Free flow of contrast across the ampulla. Please refer to the dictated operative report for full details of intraoperative findings and procedure Electronically Signed   By: Gilmer MorJaime  Wagner D.O.   On: 01/20/2017 14:45     Medical Consultants:    None.  Anti-Infectives:   IV zosyn  Subjective:    Kristen SermonIndra Nazareno she relates her pain is not controlled with no bowel movements.  Objective:    Vitals:   01/21/17 1537 01/21/17 2100 01/22/17 0231 01/22/17 0542  BP:  (!) 158/62 (!) 156/56 (!) 146/68  Pulse:  (!) 101 95 87  Resp: 18 18 17 18   Temp:  (!) 97.4 F (36.3 C) 98.4 F (36.9 C) 97.7 F (36.5 C)  TempSrc:  Oral Oral Oral  SpO2:  96% 94% 99%  Weight:      Height:        Intake/Output Summary (Last 24 hours) at 01/22/2017 1026 Last data filed at 01/22/2017 [id number] Gross per 24 hour  Intake 520 ml  Output 1955 ml  Net -1435 ml   Filed Weights   01/16/17 1826  Weight: 73.5 kg (162 lb)     Exam: General exam: In no acute distress. Respiratory system: Good air movement and clear to auscultation. Cardiovascular system: S1 & S2 heard, RRR.  Gastrointestinal system: Abdomen is nondistended, soft and nontender.  Central nervous system: Alert and oriented. No focal neurological deficits. Extremities: No pedal edema. Skin: No rashes, lesions or ulcers   Data Reviewed:    Labs: Basic Metabolic Panel: Recent Labs  Lab 01/18/17 0425 01/19/17 0415 01/20/17 0400 01/21/17 0407 01/22/17 0507  NA 143 140 135 137 134*  K 3.4* 3.4* 3.0* 3.0* 2.7*  CL 117* 108 96* 102 99*  CO2 21* 24 29 24 25   GLUCOSE 122* 120* 126* 147* 154*  BUN 11 10 8 14 9   CREATININE 0.73 0.54 0.44 0.48 0.45  CALCIUM 7.1* 7.6* 7.9* 8.3* 7.8*  MG 1.7  --  2.0  --   --    GFR Estimated Creatinine Clearance: 69.4 mL/min (by C-G formula based on SCr of 0.45 mg/dL). Liver Function Tests: Recent Labs  Lab 01/18/17 0425 01/19/17 0415 01/20/17 0400 01/21/17 0407 01/22/17 0507  AST 104* 52* 30 97* 35  ALT 249* 157* 115* 160* 106*  ALKPHOS 84 78 95 225* 184*  BILITOT 2.1* 2.1* 1.8* 1.9* 2.2*  PROT 5.5* 5.8* 6.1* 6.5 6.1*  ALBUMIN 2.6* 2.4* 2.4* 2.7* 2.5*   Recent Labs  Lab 01/18/17 0425 01/19/17 0415 01/20/17 0400 01/21/17 0407 01/22/17 0507  LIPASE 898* 142* 49 35 43   No results for input(s): AMMONIA in the last 168 hours. Coagulation profile Recent Labs  Lab 01/17/17 0110  INR 1.00    CBC: Recent Labs  Lab 01/16/17 1925 01/18/17 0425 01/19/17 0415 01/20/17 0400 01/21/17 0407 01/22/17 0507  WBC 11.0* 12.5* 11.8* 10.2 13.7* 13.6*  NEUTROABS 10.1*  --  9.8*  --   --   --   HGB 17.6* 13.1 11.2* 10.7* 10.5* 10.9*  HCT 51.5* 40.1 34.0* 32.0* 31.0* 31.9*  MCV 80.0 84.2 83.7 82.3 80.9 79.8  PLT 332 198 198 193 247 246   Cardiac Enzymes: No results for input(s): CKTOTAL, CKMB, CKMBINDEX, TROPONINI in the last 168 hours. BNP (last 3 results) No results for input(s): PROBNP  in the last 8760 hours. CBG: Recent Labs  Lab 01/21/17 0729 01/21/17 1216 01/21/17 1612 01/21/17 2242 01/22/17 0811  GLUCAP 137* 124* 119* 158* 134*   D-Dimer: No results for input(s): DDIMER in the last 72 hours. Hgb A1c: No results for input(s): HGBA1C in the last 72 hours. Lipid Profile: No results for input(s): CHOL, HDL, LDLCALC, TRIG, CHOLHDL, LDLDIRECT in the last 72 hours. Thyroid function studies: No results for input(s): TSH, T4TOTAL, T3FREE, THYROIDAB in the last 72 hours.  Invalid input(s): FREET3 Anemia work up: No results for input(s): VITAMINB12, FOLATE, FERRITIN, TIBC, IRON, RETICCTPCT in the last 72 hours. Sepsis Labs: Recent Labs  Lab 01/19/17 0415 01/20/17 0400 01/21/17 0407 01/22/17 0507  WBC 11.8* 10.2 13.7* 13.6*   Microbiology Recent Results (from the past 240 hour(s))  Culture, blood (Routine X 2) w Reflex to ID Panel     Status: None (Preliminary result)   Collection Time: 01/17/17  1:10 AM  Result Value Ref Range Status   Specimen Description BLOOD LEFT ARM  Final   Special Requests   Final    BOTTLES DRAWN AEROBIC ONLY Blood Culture adequate volume   Culture   Final    NO GROWTH 4 DAYS Performed at [facility], [address] [place], [place] [address]    Report Status PENDING  Incomplete  Culture, blood (Routine X 2) w Reflex to ID Panel     Status: None (Preliminary result)   Collection Time: 01/17/17  1:10 AM  Result Value Ref Range Status   Specimen Description BLOOD RIGHT HAND  Final   Special Requests   Final    BOTTLES DRAWN AEROBIC AND ANAEROBIC Blood Culture adequate volume   Culture   Final    NO GROWTH 4 DAYS Performed at [facility], [address] [place], [place] [address]    Report Status PENDING  Incomplete  Surgical pcr screen     Status: None   Collection Time: 01/20/17 10:16 AM  Result Value Ref Range Status   MRSA, PCR NEGATIVE NEGATIVE Final   Staphylococcus aureus NEGATIVE NEGATIVE Final     Comment: (NOTE) The Xpert SA Assay (FDA approved for NASAL specimens in patients 41 years of age and older), is one component of a comprehensive surveillance program. It is not intended to diagnose infection nor to guide or monitor treatment.      Medications:   . insulin aspart  0-5 Units Subcutaneous QHS  . insulin aspart  0-9 Units Subcutaneous TID WC  . mouth rinse  15 mL Mouth Rinse BID  . potassium chloride  40 mEq Oral Once  . saccharomyces boulardii  250 mg Oral BID   Continuous Infusions: . famotidine (PEPCID) IV 20 mg (01/22/17 0919)  . piperacillin-tazobactam (ZOSYN)  IV Stopped (01/22/17 1016)     LOS: 6 days   Marinda Elk  [facility] Pager [phone]  *Please refer to [email or web address], password [email or web address] to get updated schedule on who will round on this patient, as hospitalists switch teams weekly. If 7PM-7AM, please contact night-coverage at [email or web address], password [email or web address] for any overnight needs.  01/22/2017, 10:26 AM

## 2017-01-23 DIAGNOSIS — K8050 Calculus of bile duct without cholangitis or cholecystitis without obstruction: Secondary | ICD-10-CM

## 2017-01-23 LAB — HEPATIC FUNCTION PANEL
ALT: 77 U/L — ABNORMAL HIGH (ref 14–54)
AST: 28 U/L (ref 15–41)
Albumin: 2.4 g/dL — ABNORMAL LOW (ref 3.5–5.0)
Alkaline Phosphatase: 165 U/L — ABNORMAL HIGH (ref 38–126)
BILIRUBIN DIRECT: 0.5 mg/dL (ref 0.1–0.5)
BILIRUBIN TOTAL: 1.8 mg/dL — AB (ref 0.3–1.2)
Indirect Bilirubin: 1.3 mg/dL — ABNORMAL HIGH (ref 0.3–0.9)
Total Protein: 6.1 g/dL — ABNORMAL LOW (ref 6.5–8.1)

## 2017-01-23 LAB — C DIFFICILE QUICK SCREEN W PCR REFLEX
C DIFFICILE (CDIFF) INTERP: NOT DETECTED
C DIFFICILE (CDIFF) TOXIN: NEGATIVE
C Diff antigen: NEGATIVE

## 2017-01-23 LAB — GLUCOSE, CAPILLARY
GLUCOSE-CAPILLARY: 121 mg/dL — AB (ref 65–99)
GLUCOSE-CAPILLARY: 113 mg/dL — AB (ref 65–99)
GLUCOSE-CAPILLARY: 151 mg/dL — AB (ref 65–99)
Glucose-Capillary: 145 mg/dL — ABNORMAL HIGH (ref 65–99)

## 2017-01-23 LAB — MAGNESIUM: MAGNESIUM: 2.1 mg/dL (ref 1.7–2.4)

## 2017-01-23 MED ORDER — HYDROMORPHONE HCL 1 MG/ML IJ SOLN: 0.5000 mg | INTRAMUSCULAR | Status: DC | PRN

## 2017-01-23 MED ORDER — OXYCODONE HCL 5 MG PO TABS
5.0000 mg | ORAL_TABLET | ORAL | Status: DC | PRN
  Administered 2017-01-25: 5 mg via ORAL
  Filled 2017-01-23: qty 1

## 2017-01-23 MED ORDER — IBUPROFEN 200 MG PO TABS
400.0000 mg | ORAL_TABLET | Freq: Four times a day (QID) | ORAL | Status: DC | PRN
  Administered 2017-01-25: 400 mg via ORAL
  Filled 2017-01-23: qty 2

## 2017-01-23 MED ORDER — ENOXAPARIN SODIUM 40 MG/0.4ML ~~LOC~~ SOLN
40.0000 mg | SUBCUTANEOUS | Status: DC
  Administered 2017-01-23 – 2017-01-26 (×4): 40 mg via SUBCUTANEOUS
  Filled 2017-01-23 (×4): qty 0.4

## 2017-01-23 MED ORDER — SACCHAROMYCES BOULARDII 250 MG PO CAPS
250.0000 mg | ORAL_CAPSULE | Freq: Two times a day (BID) | ORAL | Status: DC
  Administered 2017-01-23 – 2017-01-26 (×7): 250 mg via ORAL
  Filled 2017-01-23 (×7): qty 1

## 2017-01-23 MED ORDER — PSYLLIUM 95 % PO PACK
1.0000 | PACK | Freq: Every day | ORAL | Status: DC
  Administered 2017-01-23 – 2017-01-26 (×4): 1 via ORAL
  Filled 2017-01-23 (×4): qty 1

## 2017-01-23 NOTE — Progress Notes (Signed)
[facility] PROGRESS NOTE    Progress Note  Kristen Sermonndra Pippenger  [id number] DOB: [birth year] DOA: 01/16/2017 PCP: Patient, No Pcp Per     Brief Narrative:   Kristen Sharp is an 56 y.o. female without a significant past medical history comes in for acute pancreatitis  Assessment/Plan:   Severe Gallstone pancreatitis/ Necrotizing pancreatitis/cholecystitis Admitted for cholecystitis and gallstone pancreatitis started empirically on IV Zosyn on admission GI was consulted they recommended possible ERCP. MRI showed extensive pancreatitis with necrosis of about 50% of the pancreatic head, nonocclusive thrombus. Gastroenterology was consulted agree with plan recommended i.v. fluid aggressive hydration and recommended general surgery consult for possible lap chole early next week, if intraoperative cholangiogram was positive then they will proceed with ERCP on Wednesday Her pain was controlled with IV Dilaudid general surgery was consulted, who performed a lap chole on 01/20/2017, PCA pump DC'd now on IV Dilaudid as needed. GI was reconsulted and they will perform ERCP on 01/22/2017. She started having diarrhea for some neurologist check a C. difficile PCR.  Shortness of breath: With pleural effusion on chest x-ray she was given a dose of IV Lasix and now is much improved. Lasix, the most likely cause of her pleural effusion is likely due to decreased oncotic pressure.  She does not have at this point any signs of congestive heart failure  On physical exam.  Hypokalemia: Continue replete potassium to keep above 4 try to keep mag above 2. Metabolic panel is pending.  Hyperlipidemia: She stopped taking statin 3 weeks prior to admission.  Acute diarrhea: Likely due to antibiotics, gastroenterology sent a C. difficile PCR.  DVT prophylaxis: lovenox Family Communication:Brother Disposition Plan/Barrier to D/C: unable to determine Code Status:     Code Status Orders  (From admission,  onward)        Start     Ordered   01/17/17 0042  Full code  Continuous     01/17/17 0042    Code Status History    Date Active Date Inactive Code Status Order ID Comments User Context   This patient has a current code status but no historical code status.        IV Access:    Peripheral IV   Procedures and diagnostic studies:   Dg Chest Port 1 View  Result Date: 01/22/2017 CLINICAL DATA:  Gallstone pancreatitis. EXAM: PORTABLE CHEST 1 VIEW COMPARISON:  CT 01/18/2017, chest radiograph 01/18/2017 FINDINGS: Normal cardiac silhouette. Moderate RIGHT effusion. Small LEFT effusion. No pulmonary edema. IMPRESSION: Bilateral pleural effusions.  No pulmonary edema. Electronically Signed   By: Genevive BiStewart  Edmunds M.D.   On: 01/22/2017 11:23   Dg Ercp Biliary & Pancreatic Ducts  Result Date: 01/22/2017 CLINICAL DATA:  56 year old female with a history of cholelithiasis/choledocholithiasis EXAM: ERCP TECHNIQUE: Multiple spot images obtained with the fluoroscopic device and submitted for interpretation post-procedure. FLUOROSCOPY TIME:  Fluoroscopy Time:  1 minutes 35 second COMPARISON:  01/20/2017, CT 01/18/2017 FINDINGS: Limited images during ERCP. Initial image demonstrates endoscope projecting over the upper abdomen with cannulation of the ampulla and retrograde infusion of contrast with a safety wire. Subsequent images demonstrate deployment of a retrieval balloon for treatment of choledocholithiasis. IMPRESSION: Limited images during ERCP demonstrate treatment of choledocholithiasis with a retrieval balloon. Please refer to the dictated operative report for full details of intraoperative findings and procedure. Electronically Signed   By: Gilmer MorJaime  Wagner D.O.   On: 01/22/2017 17:15     Medical Consultants:    None.  Anti-Infectives:   IV  zosyn  Subjective:    Kristen Sermonndra Hubner she relates her pain is much improved.  Objective:    Vitals:   01/22/17 1758 01/22/17 2015 01/22/17  2118 01/23/17 0537  BP: (!) 145/69  (!) 150/56 (!) 148/76  Pulse: 89  81 72  Resp: 16  18 18   Temp: 98.2 F (36.8 C)  98 F (36.7 C) 98.2 F (36.8 C)  TempSrc:   Oral Oral  SpO2: 98% 100% 100% 100%  Weight:      Height:        Intake/Output Summary (Last 24 hours) at 01/23/2017 1029 Last data filed at 01/23/2017 0729 Gross per 24 hour  Intake 1568.33 ml  Output 1650 ml  Net -81.67 ml   Filed Weights   01/16/17 1826  Weight: 73.5 kg (162 lb)    Exam: General exam: In no acute distress. Respiratory system: Good air movement and clear to auscultation. Cardiovascular system: S1 & S2 heard, RRR.  Gastrointestinal system: Abdomen is nondistended, soft and nontender.  Central nervous system: Alert and oriented. No focal neurological deficits. Extremities: No pedal edema. Skin: No rashes, lesions or ulcers   Data Reviewed:    Labs: Basic Metabolic Panel: Recent Labs  Lab 01/18/17 0425 01/19/17 0415 01/20/17 0400 01/21/17 0407 01/22/17 0507  NA 143 140 135 137 134*  K 3.4* 3.4* 3.0* 3.0* 2.7*  CL 117* 108 96* 102 99*  CO2 21* 24 29 24 25   GLUCOSE 122* 120* 126* 147* 154*  BUN 11 10 8 14 9   CREATININE 0.73 0.54 0.44 0.48 0.45  CALCIUM 7.1* 7.6* 7.9* 8.3* 7.8*  MG 1.7  --  2.0  --   --    GFR Estimated Creatinine Clearance: 69.4 mL/min (by C-G formula based on SCr of 0.45 mg/dL). Liver Function Tests: Recent Labs  Lab 01/18/17 0425 01/19/17 0415 01/20/17 0400 01/21/17 0407 01/22/17 0507  AST 104* 52* 30 97* 35  ALT 249* 157* 115* 160* 106*  ALKPHOS 84 78 95 225* 184*  BILITOT 2.1* 2.1* 1.8* 1.9* 2.2*  PROT 5.5* 5.8* 6.1* 6.5 6.1*  ALBUMIN 2.6* 2.4* 2.4* 2.7* 2.5*   Recent Labs  Lab 01/18/17 0425 01/19/17 0415 01/20/17 0400 01/21/17 0407 01/22/17 0507  LIPASE 898* 142* 49 35 43   No results for input(s): AMMONIA in the last 168 hours. Coagulation profile Recent Labs  Lab 01/17/17 0110  INR 1.00    CBC: Recent Labs  Lab 01/16/17 1925  01/18/17 0425 01/19/17 0415 01/20/17 0400 01/21/17 0407 01/22/17 0507  WBC 11.0* 12.5* 11.8* 10.2 13.7* 13.6*  NEUTROABS 10.1*  --  9.8*  --   --   --   HGB 17.6* 13.1 11.2* 10.7* 10.5* 10.9*  HCT 51.5* 40.1 34.0* 32.0* 31.0* 31.9*  MCV 80.0 84.2 83.7 82.3 80.9 79.8  PLT 332 198 198 193 247 246   Cardiac Enzymes: No results for input(s): CKTOTAL, CKMB, CKMBINDEX, TROPONINI in the last 168 hours. BNP (last 3 results) No results for input(s): PROBNP in the last 8760 hours. CBG: Recent Labs  Lab 01/22/17 0811 01/22/17 1156 01/22/17 1558 01/22/17 1715 01/22/17 2224  GLUCAP 134* 114* 115* 94 208*   D-Dimer: No results for input(s): DDIMER in the last 72 hours. Hgb A1c: No results for input(s): HGBA1C in the last 72 hours. Lipid Profile: No results for input(s): CHOL, HDL, LDLCALC, TRIG, CHOLHDL, LDLDIRECT in the last 72 hours. Thyroid function studies: No results for input(s): TSH, T4TOTAL, T3FREE, THYROIDAB in the last 72 hours.  Invalid input(s): FREET3 Anemia work up: No results for input(s): VITAMINB12, FOLATE, FERRITIN, TIBC, IRON, RETICCTPCT in the last 72 hours. Sepsis Labs: Recent Labs  Lab 01/19/17 0415 01/20/17 0400 01/21/17 0407 01/22/17 0507  WBC 11.8* 10.2 13.7* 13.6*   Microbiology Recent Results (from the past 240 hour(s))  Culture, blood (Routine X 2) w Reflex to ID Panel     Status: None   Collection Time: 01/17/17  1:10 AM  Result Value Ref Range Status   Specimen Description BLOOD LEFT ARM  Final   Special Requests   Final    BOTTLES DRAWN AEROBIC ONLY Blood Culture adequate volume   Culture   Final    NO GROWTH 5 DAYS Performed at [facility], [address] [place], [place] [address]    Report Status 01/22/2017 FINAL  Final  Culture, blood (Routine X 2) w Reflex to ID Panel     Status: None   Collection Time: 01/17/17  1:10 AM  Result Value Ref Range Status   Specimen Description BLOOD RIGHT HAND  Final   Special Requests    Final    BOTTLES DRAWN AEROBIC AND ANAEROBIC Blood Culture adequate volume   Culture   Final    NO GROWTH 5 DAYS Performed at [facility], [address], [place], [place] [address]    Report Status 01/22/2017 FINAL  Final  Surgical pcr screen     Status: None   Collection Time: 01/20/17 10:16 AM  Result Value Ref Range Status   MRSA, PCR NEGATIVE NEGATIVE Final   Staphylococcus aureus NEGATIVE NEGATIVE Final    Comment: (NOTE) The Xpert SA Assay (FDA approved for NASAL specimens in patients 32 years of age and older), is one component of a comprehensive surveillance program. It is not intended to diagnose infection nor to guide or monitor treatment.      Medications:   . enoxaparin (LOVENOX) injection  40 mg Subcutaneous Q24H  . insulin aspart  0-5 Units Subcutaneous QHS  . insulin aspart  0-9 Units Subcutaneous TID WC  . mouth rinse  15 mL Mouth Rinse BID  . potassium chloride  40 mEq Oral Once   Continuous Infusions: . famotidine (PEPCID) IV 20 mg (01/23/17 0936)  . lactated ringers with kcl 100 mL/hr at 01/23/17 0601  . piperacillin-tazobactam (ZOSYN)  IV 3.375 g (01/23/17 0602)     LOS: 7 days   Marinda Elk  [facility] Pager [phone]  *Please refer to [email or web address], password [email or web address] to get updated schedule on who will round on this patient, as hospitalists switch teams weekly. If 7PM-7AM, please contact night-coverage at [email or web address], password [email or web address] for any overnight needs.  01/23/2017, 10:29 AM

## 2017-01-23 NOTE — Progress Notes (Signed)
[facility] Progress Note  1 Day Post-Op  Subjective: CC: diarrhea Patient states abdominal pain is much improved from yesterday but she has been up and down all morning with diarrhea. Denies nausea.  UOP good. VSS.   Objective: Vital signs in last 24 hours: Temp:  [98 F (36.7 C)-99.4 F (37.4 C)] 98.2 F (36.8 C) (12/20 0537) Pulse Rate:  [72-100] 72 (12/20 0537) Resp:  [16-20] 18 (12/20 0537) BP: (131-156)/(56-76) 148/76 (12/20 0537) SpO2:  [95 %-100 %] 100 % (12/20 0537) Last BM Date: 01/23/17  Intake/Output from previous day: 12/19 0701 - 12/20 0700 In: 1508.3 [P.O.:240; I.V.:1068.3; IV Piggyback:200] Out: 1700 [Urine:1175; Stool:525] Intake/Output this shift: Total I/O In: 60 [P.O.:60] Out: 100 [Urine:100]  PE: Gen:  Alert, NAD, pleasant Card:  Regular rate and rhythm, pedal pulses 2+ BL Pulm:  Normal effort, clear to auscultation bilaterally Abd: Soft, mildly tender across upper abdomen, non-distended, bowel sounds present, no HSM, incisions C/D/I Skin: warm and dry, no rashes  Psych: A&Ox3   Lab Results:  Recent Labs    01/21/17 0407 01/22/17 0507  WBC 13.7* 13.6*  HGB 10.5* 10.9*  HCT 31.0* 31.9*  PLT 247 246   BMET Recent Labs    01/21/17 0407 01/22/17 0507  NA 137 134*  K 3.0* 2.7*  CL 102 99*  CO2 24 25  GLUCOSE 147* 154*  BUN 14 9  CREATININE 0.48 0.45  CALCIUM 8.3* 7.8*   PT/INR No results for input(s): LABPROT, INR in the last 72 hours. CMP     Component Value Date/Time   NA 134 (L) 01/22/2017 0507   K 2.7 (LL) 01/22/2017 0507   CL 99 (L) 01/22/2017 0507   CO2 25 01/22/2017 0507   GLUCOSE 154 (H) 01/22/2017 0507   BUN 9 01/22/2017 0507   CREATININE 0.45 01/22/2017 0507   CALCIUM 7.8 (L) 01/22/2017 0507   PROT 6.1 (L) 01/22/2017 0507   ALBUMIN 2.5 (L) 01/22/2017 0507   AST 35 01/22/2017 0507   ALT 106 (H) 01/22/2017 0507   ALKPHOS 184 (H) 01/22/2017 0507   BILITOT 2.2 (H) 01/22/2017 0507   GFRNONAA >60  01/22/2017 0507   GFRAA >60 01/22/2017 0507   Lipase     Component Value Date/Time   LIPASE 43 01/22/2017 0507       Studies/Results: Dg Chest Port 1 View  Result Date: 01/22/2017 CLINICAL DATA:  Gallstone pancreatitis. EXAM: PORTABLE CHEST 1 VIEW COMPARISON:  CT 01/18/2017, chest radiograph 01/18/2017 FINDINGS: Normal cardiac silhouette. Moderate RIGHT effusion. Small LEFT effusion. No pulmonary edema. IMPRESSION: Bilateral pleural effusions.  No pulmonary edema. Electronically Signed   By: Genevive BiStewart  Edmunds M.D.   On: 01/22/2017 11:23   Dg Ercp Biliary & Pancreatic Ducts  Result Date: 01/22/2017 CLINICAL DATA:  56 year old female with a history of cholelithiasis/choledocholithiasis EXAM: ERCP TECHNIQUE: Multiple spot images obtained with the fluoroscopic device and submitted for interpretation post-procedure. FLUOROSCOPY TIME:  Fluoroscopy Time:  1 minutes 35 second COMPARISON:  01/20/2017, CT 01/18/2017 FINDINGS: Limited images during ERCP. Initial image demonstrates endoscope projecting over the upper abdomen with cannulation of the ampulla and retrograde infusion of contrast with a safety wire. Subsequent images demonstrate deployment of a retrieval balloon for treatment of choledocholithiasis. IMPRESSION: Limited images during ERCP demonstrate treatment of choledocholithiasis with a retrieval balloon. Please refer to the dictated operative report for full details of intraoperative findings and procedure. Electronically Signed   By: Gilmer MorJaime  Wagner D.O.   On: 01/22/2017 17:15    Anti-infectives:  Anti-infectives (From admission, onward)   Start     Dose/Rate Route Frequency Ordered Stop   01/20/17 1225  piperacillin-tazobactam (ZOSYN) 3.375 GM/50ML IVPB    Comments:  [name], [name]   : cabinet override      01/20/17 1225 01/20/17 1300   01/17/17 0400  piperacillin-tazobactam (ZOSYN) IVPB 3.375 g     3.375 g 12.5 mL/hr over 240 Minutes Intravenous Every 8 hours 01/17/17 0052      01/16/17 2130  piperacillin-tazobactam (ZOSYN) IVPB 3.375 g     3.375 g 100 mL/hr over 30 Minutes Intravenous  Once 01/16/17 2119 01/16/17 2158       Assessment/Plan Biliary pancreatitis, cholelithiasis, elevated LFTs S/P lap chole with IOC 01/20/17 Dr. Fredricka Bonineonnor S/p ERCP 12/19 Dr. Ewing SchleinMagod - minimal debris removed and biliary sphincterotomy performed - POD#3 - IOC showed defect in CBD, bili and lipase trending up slightly  - labs unable to be drawn this AM, try again this afternoon - patient clinically looks much better today  FEN: start CLD and ADAT VTE: SCDs ID: IV zosyn (12/13>>)  Plan: Start clears today. Recheck labs. Check C.Diff.    LOS: 7 days    Wells GuilesKelly Rayburn , [facility] 01/23/2017, 8:03 AM Pager: [phone] Consults: [phone] Mon-Fri 7:00 am-4:30 pm Sat-Sun 7:00 am-11:30 am

## 2017-01-23 NOTE — Anesthesia Postprocedure Evaluation (Signed)
Anesthesia Post Note  Patient: Traci Sermonndra Beougher  Procedure(s) Performed: ENDOSCOPIC RETROGRADE CHOLANGIOPANCREATOGRAPHY (ERCP) WITH PROPOFOL (N/A )     Patient location during evaluation: PACU Anesthesia Type: General Level of consciousness: awake and alert Pain management: pain level controlled Vital Signs Assessment: post-procedure vital signs reviewed and stable Respiratory status: spontaneous breathing, nonlabored ventilation, respiratory function stable and patient connected to nasal cannula oxygen Cardiovascular status: blood pressure returned to baseline and stable Postop Assessment: no apparent nausea or vomiting Anesthetic complications: no    Last Vitals:  Vitals:   01/22/17 2118 01/23/17 0537  BP: (!) 150/56 (!) 148/76  Pulse: 81 72  Resp: 18 18  Temp: 36.7 C 36.8 C  SpO2: 100% 100%    Last Pain:  Vitals:   01/23/17 0537  TempSrc: Oral  PainSc:                  Haynes Giannotti

## 2017-01-23 NOTE — Progress Notes (Signed)
Pharmacy Antibiotic Note  Traci Sermonndra Lard is a 56 y.o. female with nausea, vomiting and abdominal pain admitted on 01/16/2017 with acute cholecystitis, gallstone pancreatitis, necrotizing.  Pharmacy has been consulted for zosyn dosing.  Today, 01/23/2017  Day #7 zosyn  POD #3 lap chole, s/p ERCP 12/19  afebrile  Plan: Zosyn 3.375g IV q8h (4 hour infusion).   Recommend stopping zosyn after today's doses if clinically appropriate  Pharmacy to sign-off  Height: 5' (152.4 cm) Weight: 162 lb (73.5 kg) IBW/kg (Calculated) : 45.5  Temp (24hrs), Avg:98.4 F (36.9 C), Min:98 F (36.7 C), Max:99.4 F (37.4 C)  Recent Labs  Lab 01/18/17 0425 01/19/17 0415 01/20/17 0400 01/21/17 0407 01/22/17 0507  WBC 12.5* 11.8* 10.2 13.7* 13.6*  CREATININE 0.73 0.54 0.44 0.48 0.45    Estimated Creatinine Clearance: 69.4 mL/min (by C-G formula based on SCr of 0.45 mg/dL).    No Known Allergies  Antimicrobials this admission: 12/13 zosyn >>   Microbiology results: 12/14 BCx: ngtd   Thank you for allowing pharmacy to be a part of this patient's care.  Juliette Alcideustin Zeigler, PharmD, BCPS.   Pager: [phone] 01/23/2017 7:27 AM

## 2017-01-23 NOTE — Progress Notes (Signed)
Kristen Sharp 1:03 PM  Subjective: Patient feeling a little better less pain wants to eat some increased diarrhea but not dark this morning no new complaints and case discussed with her sister and brother and answered all their questions and we reviewed her ERCP findings  Objective: Vital signs stable afebrile no acute distress minimal right upper quadrant discomfort nontender elsewhere occasional bowel sounds LFTs decreased CBC pending C. difficile antigen negative  Assessment: Gallstone pancreatitis status post ERCP and laparoscopic cholecystectomy  Plan: Will stop her antibiotics since cultures negative and that may be playing a role with her diarrhea although it may all be postcholecystectomy and will allow soft solids low-fat and hopefully she can go home soon and probably follow-up with [place] doctors in [place]  [facility],Pankaj Haack E  Pager [phone] After 5PM or if no answer call [phone]

## 2017-01-24 LAB — CBC WITH DIFFERENTIAL/PLATELET
Basophils Absolute: 0 10*3/uL (ref 0.0–0.1)
Basophils Relative: 0 %
EOS PCT: 0 %
Eosinophils Absolute: 0 10*3/uL (ref 0.0–0.7)
HEMATOCRIT: 29.4 % — AB (ref 36.0–46.0)
HEMOGLOBIN: 10 g/dL — AB (ref 12.0–15.0)
LYMPHS ABS: 2.1 10*3/uL (ref 0.7–4.0)
LYMPHS PCT: 12 %
MCH: 27.1 pg (ref 26.0–34.0)
MCHC: 34 g/dL (ref 30.0–36.0)
MCV: 79.7 fL (ref 78.0–100.0)
MONOS PCT: 6 %
Monocytes Absolute: 1 10*3/uL (ref 0.1–1.0)
Neutro Abs: 14 10*3/uL — ABNORMAL HIGH (ref 1.7–7.7)
Neutrophils Relative %: 82 %
Platelets: 277 10*3/uL (ref 150–400)
RBC: 3.69 MIL/uL — AB (ref 3.87–5.11)
RDW: 15.2 % (ref 11.5–15.5)
WBC: 17.1 10*3/uL — AB (ref 4.0–10.5)

## 2017-01-24 LAB — COMPREHENSIVE METABOLIC PANEL
ALBUMIN: 2.4 g/dL — AB (ref 3.5–5.0)
ALK PHOS: 142 U/L — AB (ref 38–126)
ALT: 58 U/L — AB (ref 14–54)
AST: 21 U/L (ref 15–41)
Anion gap: 8 (ref 5–15)
BILIRUBIN TOTAL: 1.1 mg/dL (ref 0.3–1.2)
BUN: 8 mg/dL (ref 6–20)
CALCIUM: 7.8 mg/dL — AB (ref 8.9–10.3)
CO2: 24 mmol/L (ref 22–32)
CREATININE: 0.44 mg/dL (ref 0.44–1.00)
Chloride: 104 mmol/L (ref 101–111)
GFR calc Af Amer: >60 mL/min (ref >60)
GLUCOSE: 160 mg/dL — AB (ref 65–99)
Potassium: 2.7 mmol/L — CL (ref 3.5–5.1)
Sodium: 136 mmol/L (ref 135–145)
TOTAL PROTEIN: 6 g/dL — AB (ref 6.5–8.1)

## 2017-01-24 LAB — GLUCOSE, CAPILLARY
GLUCOSE-CAPILLARY: 151 mg/dL — AB (ref 65–99)
Glucose-Capillary: 109 mg/dL — ABNORMAL HIGH (ref 65–99)
Glucose-Capillary: 169 mg/dL — ABNORMAL HIGH (ref 65–99)
Glucose-Capillary: 132 mg/dL — ABNORMAL HIGH (ref 65–99)

## 2017-01-24 LAB — LIPASE, BLOOD: Lipase: 87 U/L — ABNORMAL HIGH (ref 11–51)

## 2017-01-24 MED ORDER — PANCRELIPASE (LIP-PROT-AMYL) 12000-38000 UNITS PO CPEP
24000.0000 [IU] | ORAL_CAPSULE | Freq: Three times a day (TID) | ORAL | Status: DC
  Administered 2017-01-24 – 2017-01-25 (×2): 24000 [IU] via ORAL
  Filled 2017-01-24 (×2): qty 2

## 2017-01-24 MED ORDER — POTASSIUM CHLORIDE CRYS ER 20 MEQ PO TBCR
40.0000 meq | EXTENDED_RELEASE_TABLET | Freq: Two times a day (BID) | ORAL | Status: AC
  Administered 2017-01-24 (×2): 40 meq via ORAL
  Filled 2017-01-24 (×2): qty 2

## 2017-01-24 MED ORDER — PANCRELIPASE (LIP-PROT-AMYL) 12000-38000 UNITS PO CPEP
36000.0000 [IU] | ORAL_CAPSULE | Freq: Three times a day (TID) | ORAL | Status: DC
  Administered 2017-01-24: 36000 [IU] via ORAL
  Filled 2017-01-24 (×2): qty 3

## 2017-01-24 NOTE — Progress Notes (Signed)
CRITICAL VALUE ALERT  Critical Value: Potassium 2.7 Date & Time Notied:  01/24/2017 0700  Provider Notified: Dr.Feliz-Ortiz  Orders Received/Actions taken:

## 2017-01-24 NOTE — Progress Notes (Signed)
[facility] Progress Note  2 Days Post-Op  Subjective: CC: diarrhea Patient still having diarrhea, although she feels like it is maybe a little less frequent. Some pain with eating more solid foods, pain soothed with hot tea.  UOP good. VSS.   Objective: Vital signs in last 24 hours: Temp:  [97.7 F (36.5 C)-98.3 F (36.8 C)] 98.3 F (36.8 C) (12/21 0529) Pulse Rate:  [76-79] 79 (12/21 0529) Resp:  [18] 18 (12/21 0529) BP: (145-163)/(63-68) 163/64 (12/21 0529) SpO2:  [94 %-100 %] 94 % (12/21 0529) Last BM Date: 01/24/17  Intake/Output from previous day: 12/20 0701 - 12/21 0700 In: 1701 [P.O.:900; I.V.:701; IV Piggyback:100] Out: 100 [Urine:100] Intake/Output this shift: No intake/output data recorded.  PE: Gen: Alert, NAD, pleasant Card: Regular rate and rhythm, pedal pulses 2+ BL Pulm: Normal effort, clear to auscultation bilaterally Abd: Soft,mildlytender across upper abdomen, non-distended, bowel soundspresent, no HSM, incisions C/D/I Skin: warm and dry, no rashes  Psych: A&Ox3   Lab Results:  Recent Labs    01/22/17 0507 01/24/17 0529  WBC 13.6* 17.1*  HGB 10.9* 10.0*  HCT 31.9* 29.4*  PLT 246 277   BMET Recent Labs    01/22/17 0507 01/24/17 0529  NA 134* 136  K 2.7* 2.7*  CL 99* 104  CO2 25 24  GLUCOSE 154* 160*  BUN 9 8  CREATININE 0.45 0.44  CALCIUM 7.8* 7.8*   PT/INR No results for input(s): LABPROT, INR in the last 72 hours. CMP     Component Value Date/Time   NA 136 01/24/2017 0529   K 2.7 (LL) 01/24/2017 0529   CL 104 01/24/2017 0529   CO2 24 01/24/2017 0529   GLUCOSE 160 (H) 01/24/2017 0529   BUN 8 01/24/2017 0529   CREATININE 0.44 01/24/2017 0529   CALCIUM 7.8 (L) 01/24/2017 0529   PROT 6.0 (L) 01/24/2017 0529   ALBUMIN 2.4 (L) 01/24/2017 0529   AST 21 01/24/2017 0529   ALT 58 (H) 01/24/2017 0529   ALKPHOS 142 (H) 01/24/2017 0529   BILITOT 1.1 01/24/2017 0529   GFRNONAA >60 01/24/2017 0529   GFRAA >60  01/24/2017 0529   Lipase     Component Value Date/Time   LIPASE 87 (H) 01/24/2017 0529       Studies/Results: Dg Chest Port 1 View  Result Date: 01/22/2017 CLINICAL DATA:  Gallstone pancreatitis. EXAM: PORTABLE CHEST 1 VIEW COMPARISON:  CT 01/18/2017, chest radiograph 01/18/2017 FINDINGS: Normal cardiac silhouette. Moderate RIGHT effusion. Small LEFT effusion. No pulmonary edema. IMPRESSION: Bilateral pleural effusions.  No pulmonary edema. Electronically Signed   By: Genevive BiStewart  Edmunds M.D.   On: 01/22/2017 11:23   Dg Ercp Biliary & Pancreatic Ducts  Result Date: 01/22/2017 CLINICAL DATA:  56 year old female with a history of cholelithiasis/choledocholithiasis EXAM: ERCP TECHNIQUE: Multiple spot images obtained with the fluoroscopic device and submitted for interpretation post-procedure. FLUOROSCOPY TIME:  Fluoroscopy Time:  1 minutes 35 second COMPARISON:  01/20/2017, CT 01/18/2017 FINDINGS: Limited images during ERCP. Initial image demonstrates endoscope projecting over the upper abdomen with cannulation of the ampulla and retrograde infusion of contrast with a safety wire. Subsequent images demonstrate deployment of a retrieval balloon for treatment of choledocholithiasis. IMPRESSION: Limited images during ERCP demonstrate treatment of choledocholithiasis with a retrieval balloon. Please refer to the dictated operative report for full details of intraoperative findings and procedure. Electronically Signed   By: Gilmer MorJaime  Wagner D.O.   On: 01/22/2017 17:15    Anti-infectives: Anti-infectives (From admission, onward)   Start  Dose/Rate Route Frequency Ordered Stop   01/20/17 1225  piperacillin-tazobactam (ZOSYN) 3.375 GM/50ML IVPB    Comments:  [name], [name]   : cabinet override      01/20/17 1225 01/20/17 1300   01/17/17 0400  piperacillin-tazobactam (ZOSYN) IVPB 3.375 g  Status:  Discontinued     3.375 g 12.5 mL/hr over 240 Minutes Intravenous Every 8 hours 01/17/17 0052 01/23/17  1301   01/16/17 2130  piperacillin-tazobactam (ZOSYN) IVPB 3.375 g     3.375 g 100 mL/hr over 30 Minutes Intravenous  Once 01/16/17 2119 01/16/17 2158       Assessment/Plan Biliary pancreatitis, cholelithiasis, elevated LFTs S/P lap chole with IOC 01/20/17 Dr. Fredricka Bonineonnor S/p ERCP 12/19 Dr. Ewing SchleinMagod - minimal debris removed and biliary sphincterotomy performed -POD#4 - bili trending down, lipase up slightly  - patient clinically looks alright, although I feel that pain with eating more solid food likely indicative of mild pancreatitis still Diarrhea - abx stopped yesterday, would hold off on antidiarrheals for now, low fat diet Hypokalemia - replacement per primary service Leukocytosis - WBC 17.1, C diff negative  FEN: liquids with soft foods as tolerated VTE: SCDs ID: IV zosyn (12/13>12/20)   Plan:Would continue to monitor lipase and treat like pancreatitis with rising lipase and leukocytosis.     LOS: 8 days    Wells GuilesKelly Rayburn , [facility] 01/24/2017, 9:01 AM Pager: [phone] Consults: [phone] Mon-Fri 7:00 am-4:30 pm Sat-Sun 7:00 am-11:30 am

## 2017-01-24 NOTE — Progress Notes (Signed)
Kristen SermonIndra Sharp 11:39 AM  Subjective: Patient doing much better with only minimal midepigastric discomfort when she eats and diarrhea is better and she is in good spirits and case discussed with her sister as well  Objective: Vital signs stable afebrile abdomen is soft very minimal midepigastric discomfort no guarding or rebound labs all improved except white count slightly increased liver tests better hemoglobin minimal drop  Assessment: Improved  Plan: Discuss case with surgical team will add pancreatic enzymes hopefully can go home tomorrow and we discussed possible pancreatic cyst formation which could be problematic in the future and she'll need close [place] follow-up with either a surgeon or gastroenterologist or both and I'm happy to see back in the office in early January if she does not return to [place] by then and please call my partner tomorrow if any question or problem  [facility],Avon Molock E  Pager [phone] After 5PM or if no answer call [phone]

## 2017-01-24 NOTE — Progress Notes (Signed)
[facility] PROGRESS NOTE    Progress Note  Kristen Sharp  WUJ:[id number] DOB: [birth year] DOA: 01/16/2017 PCP: Patient, No Pcp Per     Brief Narrative:   Kristen Sharp is an 56 y.o. female without a significant past medical history comes in for acute pancreatitis  Assessment/Plan:   Severe Gallstone pancreatitis/ Necrotizing pancreatitis/cholecystitis Admitted for cholecystitis and gallstone pancreatitis started empirically on IV Zosyn on admission which she completed treatment in house. GI was consulted they recommended possible ERCP. MRI showed extensive pancreatitis with necrosis of about 50% of the pancreatic head, nonocclusive thrombus. Treated with I.v. fluid aggressive hydration and recommended general surgery consult for possible lap choli performed on 12.17.2018,intraoperative cholangiogram was positive, they rec ERCP which was done on 12.19.2018 Her pain was controlled with PCA pump and  IV Dilaudid as needed. GI was reconsulted and they  perform ERCP on 01/22/2017. C. difficile was checked due to her diarrhea which was negative. She was started on Metamucil and Creon tablets with meals.  Shortness of breath: With pleural effusion on chest x-ray she was given a dose of IV Lasix and now is much improved.  the most likely cause of her pleural effusion is likely due to decreased oncotic pressure.  She does not have at this point any signs of congestive heart failure  On physical exam.  Hypokalemia: Continue replete potassium to keep above 4 try to keep mag above 2. Metabolic panel is pending.  Hyperlipidemia: She stopped taking statin 3 weeks prior to admission.  Acute diarrhea: Likely due to antibiotics, antibiotics were stopped, C. difficile PCR was negative.  She was started on Creon and Metamucil will have to reevaluate tomorrow morning.  DVT prophylaxis: lovenox Family Communication:Brother Disposition Plan/Barrier to D/C: Hopefully tomorrow. Code Status:      Code Status Orders  (From admission, onward)        Start     Ordered   01/17/17 0042  Full code  Continuous     01/17/17 0042    Code Status History    Date Active Date Inactive Code Status Order ID Comments User Context   This patient has a current code status but no historical code status.        IV Access:    Peripheral IV   Procedures and diagnostic studies:   Dg Ercp Biliary & Pancreatic Ducts  Result Date: 01/22/2017 CLINICAL DATA:  56 year old female with a history of cholelithiasis/choledocholithiasis EXAM: ERCP TECHNIQUE: Multiple spot images obtained with the fluoroscopic device and submitted for interpretation post-procedure. FLUOROSCOPY TIME:  Fluoroscopy Time:  1 minutes 35 second COMPARISON:  01/20/2017, CT 01/18/2017 FINDINGS: Limited images during ERCP. Initial image demonstrates endoscope projecting over the upper abdomen with cannulation of the ampulla and retrograde infusion of contrast with a safety wire. Subsequent images demonstrate deployment of a retrieval balloon for treatment of choledocholithiasis. IMPRESSION: Limited images during ERCP demonstrate treatment of choledocholithiasis with a retrieval balloon. Please refer to the dictated operative report for full details of intraoperative findings and procedure. Electronically Signed   By: Gilmer Mor D.O.   On: 01/22/2017 17:15     Medical Consultants:    None.  Anti-Infectives:   IV zosyn  Subjective:    Joylyn Duggin rates her pain is resolved she continues to have diarrhea.  Objective:    Vitals:   01/23/17 1300 01/23/17 2159 01/24/17 0529 01/24/17 0941  BP: (!) 156/63 (!) 145/68 (!) 163/64 (!) 154/73  Pulse: 77 76 79 74  Resp: 18 18  18 18  Temp: 98.3 F (36.8 C) 97.7 F (36.5 C) 98.3 F (36.8 C) 98.2 F (36.8 C)  TempSrc: Oral Oral Oral Oral  SpO2: 100% 100% 94% 97%  Weight:      Height:        Intake/Output Summary (Last 24 hours) at 01/24/2017 1217 Last data  filed at 01/24/2017 1000 Gross per 24 hour  Intake 1921 ml  Output 300 ml  Net 1621 ml   Filed Weights   01/16/17 1826  Weight: 73.5 kg (162 lb)    Exam: General exam: In no acute distress. Respiratory system: Good air movement and clear to auscultation. Cardiovascular system: S1 & S2 heard, RRR.  Gastrointestinal system: Abdomen is nondistended, soft and nontender.  Central nervous system: Alert and oriented. No focal neurological deficits. Extremities: No pedal edema. Skin: No rashes, lesions or ulcers   Data Reviewed:    Labs: Basic Metabolic Panel: Recent Labs  Lab 01/18/17 0425 01/19/17 0415 01/20/17 0400 01/21/17 0407 01/22/17 0507 01/23/17 1108 01/24/17 0529  NA 143 140 135 137 134*  --  136  K 3.4* 3.4* 3.0* 3.0* 2.7*  --  2.7*  CL 117* 108 96* 102 99*  --  104  CO2 21* 24 29 24 25   --  24  GLUCOSE 122* 120* 126* 147* 154*  --  160*  BUN 11 10 8 14 9   --  8  CREATININE 0.73 0.54 0.44 0.48 0.45  --  0.44  CALCIUM 7.1* 7.6* 7.9* 8.3* 7.8*  --  7.8*  MG 1.7  --  2.0  --   --  2.1  --    GFR Estimated Creatinine Clearance: 68.6 mL/min (by C-G formula based on SCr of 0.44 mg/dL). Liver Function Tests: Recent Labs  Lab 01/20/17 0400 01/21/17 0407 01/22/17 0507 01/23/17 1108 01/24/17 0529  AST 30 97* 35 28 21  ALT 115* 160* 106* 77* 58*  ALKPHOS 95 225* 184* 165* 142*  BILITOT 1.8* 1.9* 2.2* 1.8* 1.1  PROT 6.1* 6.5 6.1* 6.1* 6.0*  ALBUMIN 2.4* 2.7* 2.5* 2.4* 2.4*   Recent Labs  Lab 01/19/17 0415 01/20/17 0400 01/21/17 0407 01/22/17 0507 01/24/17 0529  LIPASE 142* 49 35 43 87*   No results for input(s): AMMONIA in the last 168 hours. Coagulation profile No results for input(s): INR, PROTIME in the last 168 hours.  CBC: Recent Labs  Lab 01/19/17 0415 01/20/17 0400 01/21/17 0407 01/22/17 0507 01/24/17 0529  WBC 11.8* 10.2 13.7* 13.6* 17.1*  NEUTROABS 9.8*  --   --   --  14.0*  HGB 11.2* 10.7* 10.5* 10.9* 10.0*  HCT 34.0* 32.0* 31.0*  31.9* 29.4*  MCV 83.7 82.3 80.9 79.8 79.7  PLT 198 193 247 246 277   Cardiac Enzymes: No results for input(s): CKTOTAL, CKMB, CKMBINDEX, TROPONINI in the last 168 hours. BNP (last 3 results) No results for input(s): PROBNP in the last 8760 hours. CBG: Recent Labs  Lab 01/23/17 1210 01/23/17 1703 01/23/17 2120 01/24/17 0743 01/24/17 1204  GLUCAP 113* 151* 145* 151* 109*   D-Dimer: No results for input(s): DDIMER in the last 72 hours. Hgb A1c: No results for input(s): HGBA1C in the last 72 hours. Lipid Profile: No results for input(s): CHOL, HDL, LDLCALC, TRIG, CHOLHDL, LDLDIRECT in the last 72 hours. Thyroid function studies: No results for input(s): TSH, T4TOTAL, T3FREE, THYROIDAB in the last 72 hours.  Invalid input(s): FREET3 Anemia work up: No results for input(s): VITAMINB12, FOLATE, FERRITIN, TIBC, IRON, RETICCTPCT in  the last 72 hours. Sepsis Labs: Recent Labs  Lab 01/20/17 0400 01/21/17 0407 01/22/17 0507 01/24/17 0529  WBC 10.2 13.7* 13.6* 17.1*   Microbiology Recent Results (from the past 240 hour(s))  Culture, blood (Routine X 2) w Reflex to ID Panel     Status: None   Collection Time: 01/17/17  1:10 AM  Result Value Ref Range Status   Specimen Description BLOOD LEFT ARM  Final   Special Requests   Final    BOTTLES DRAWN AEROBIC ONLY Blood Culture adequate volume   Culture   Final    NO GROWTH 5 DAYS Performed at [facility], [address] [place], [place] [address]    Report Status 01/22/2017 FINAL  Final  Culture, blood (Routine X 2) w Reflex to ID Panel     Status: None   Collection Time: 01/17/17  1:10 AM  Result Value Ref Range Status   Specimen Description BLOOD RIGHT HAND  Final   Special Requests   Final    BOTTLES DRAWN AEROBIC AND ANAEROBIC Blood Culture adequate volume   Culture   Final    NO GROWTH 5 DAYS Performed at [facility], [address] [place], [place] [address]    Report Status 01/22/2017 FINAL  Final   Surgical pcr screen     Status: None   Collection Time: 01/20/17 10:16 AM  Result Value Ref Range Status   MRSA, PCR NEGATIVE NEGATIVE Final   Staphylococcus aureus NEGATIVE NEGATIVE Final    Comment: (NOTE) The Xpert SA Assay (FDA approved for NASAL specimens in patients 58 years of age and older), is one component of a comprehensive surveillance program. It is not intended to diagnose infection nor to guide or monitor treatment.   C difficile quick scan w PCR reflex     Status: None   Collection Time: 01/23/17  9:05 AM  Result Value Ref Range Status   C Diff antigen NEGATIVE NEGATIVE Final   C Diff toxin NEGATIVE NEGATIVE Final   C Diff interpretation No C. difficile detected.  Final     Medications:   . enoxaparin (LOVENOX) injection  40 mg Subcutaneous Q24H  . insulin aspart  0-5 Units Subcutaneous QHS  . insulin aspart  0-9 Units Subcutaneous TID WC  . lipase/protease/amylase  36,000 Units Oral TID AC  . mouth rinse  15 mL Mouth Rinse BID  . potassium chloride  40 mEq Oral Once  . potassium chloride  40 mEq Oral BID  . psyllium  1 packet Oral Daily  . saccharomyces boulardii  250 mg Oral BID   Continuous Infusions: . famotidine (PEPCID) IV Stopped (01/24/17 1040)  . lactated ringers with kcl 10 mL/hr at 01/23/17 1109     LOS: 8 days   Marinda Elkbraham Feliz Ortiz  [facility] Pager [phone]  *Please refer to [email or web address], password [email or web address] to get updated schedule on who will round on this patient, as hospitalists switch teams weekly. If 7PM-7AM, please contact night-coverage at [email or web address], password [email or web address] for any overnight needs.  01/24/2017, 12:17 PM

## 2017-01-25 LAB — CBC
HEMATOCRIT: 30.9 % — AB (ref 36.0–46.0)
HEMOGLOBIN: 10.3 g/dL — AB (ref 12.0–15.0)
MCH: 27 pg (ref 26.0–34.0)
MCHC: 33.3 g/dL (ref 30.0–36.0)
MCV: 81.1 fL (ref 78.0–100.0)
Platelets: 302 10*3/uL (ref 150–400)
RBC: 3.81 MIL/uL — AB (ref 3.87–5.11)
RDW: 15.2 % (ref 11.5–15.5)
WBC: 16 10*3/uL — AB (ref 4.0–10.5)

## 2017-01-25 LAB — BASIC METABOLIC PANEL
ANION GAP: 9 (ref 5–15)
BUN: 5 mg/dL — AB (ref 6–20)
CHLORIDE: 105 mmol/L (ref 101–111)
CO2: 21 mmol/L — ABNORMAL LOW (ref 22–32)
Calcium: 8.2 mg/dL — ABNORMAL LOW (ref 8.9–10.3)
Creatinine, Ser: 0.4 mg/dL — ABNORMAL LOW (ref 0.44–1.00)
GFR calc Af Amer: >60 mL/min (ref >60)
GFR calc non Af Amer: >60 mL/min (ref >60)
Glucose, Bld: 137 mg/dL — ABNORMAL HIGH (ref 65–99)
POTASSIUM: 3.5 mmol/L (ref 3.5–5.1)
SODIUM: 135 mmol/L (ref 135–145)

## 2017-01-25 LAB — GLUCOSE, CAPILLARY
GLUCOSE-CAPILLARY: 143 mg/dL — AB (ref 65–99)
GLUCOSE-CAPILLARY: 147 mg/dL — AB (ref 65–99)
Glucose-Capillary: 109 mg/dL — ABNORMAL HIGH (ref 65–99)
Glucose-Capillary: 118 mg/dL — ABNORMAL HIGH (ref 65–99)

## 2017-01-25 LAB — LIPASE, BLOOD: Lipase: 105 U/L — ABNORMAL HIGH (ref 11–51)

## 2017-01-25 MED ORDER — PROCHLORPERAZINE EDISYLATE 5 MG/ML IJ SOLN: 5.0000 mg | INTRAMUSCULAR | Status: DC | PRN

## 2017-01-25 MED ORDER — PANCRELIPASE (LIP-PROT-AMYL) 12000-38000 UNITS PO CPEP
36000.0000 [IU] | ORAL_CAPSULE | Freq: Three times a day (TID) | ORAL | Status: DC
  Administered 2017-01-25 – 2017-01-26 (×3): 36000 [IU] via ORAL
  Filled 2017-01-25 (×3): qty 3

## 2017-01-25 NOTE — Progress Notes (Signed)
S: Pain a little better but still hurts after eating  Vitals, labs, intake/output, and orders reviewed at this time. WBC 16, lipase uptrending  Gen: A&Ox3, no distress  H&N: EOMI, atraumatic, neck supple Chest: unlabored respirations, RRR Abd: soft, midlly tender, mildly distended, incisions c/d/i  Ext: warm, no edema Neuro: grossly normal  Lines/tubes/drains: PIV  A/P:  Necrotizing/hemorrhagic pancreatitis. S/p cholecystectomy and ERCP.  Still having symptoms and labs c/w pancreatitis. Continue supportive care. We will follow peripherally.    Kristen Blakeshelsea Regan Llorente, MD [facility], [place] Pager [phone]

## 2017-01-25 NOTE — Progress Notes (Signed)
[facility] PROGRESS NOTE    Progress Note  Kristen Sharp  ZOX:[id number] DOB: 11-15-1960 DOA: 01/16/2017 PCP: Patient, No Pcp Per     Brief Narrative:   Kristen Sharp is an 56 y.o. female without a significant past medical history comes in for acute pancreatitis  Assessment/Plan:   Severe Gallstone pancreatitis/ Necrotizing pancreatitis/cholecystitis Admitted for cholecystitis and gallstone pancreatitis started empirically on IV Zosyn on admission which she completed treatment in house. GI was consulted they recommended possible ERCP. MRI showed extensive pancreatitis with necrosis of about 50% of the pancreatic head, nonocclusive thrombus. Treated with I.v. fluid aggressivelyn and recommended general surgery consult for  lap choli performed on 12.17.2018,intraoperative cholangiogram was positive, they performed ERCP which was done on 12.19.2018 Her pain was controlled with PCA pump and  IV Dilaudid as needed. GI was reconsulted and they  perform ERCP on 01/22/2017. C. difficile was checked due to her diarrhea which was negative. She was started on Metamucil and Creon tablets with meals. Metamucil did improve her diarrhea.  Continue to improve slowly she continues to remain afebrile But she relates she still feels bloated, she is eating less than 50% of her meals.  We will keep her an additional 24 hours to see how she does with her meals and her new dose of Creon.  Shortness of breath: With pleural effusion on chest x-ray she was given a dose of IV Lasix and now is much improved.  the most likely cause of her pleural effusion is likely due to decreased oncotic pressure.  She does not have at this point any signs of congestive heart failure  On physical exam.  Hypokalemia: Continue replete potassium to keep above 4 try to keep mag above 2. Metabolic panel is pending.  Hyperlipidemia: She stopped taking statin 3 weeks prior to admission.  Acute diarrhea: Likely due to  antibiotics, antibiotics were stopped, C. difficile PCR was negative.  She was started on Creon and Metamucil will have to reevaluate tomorrow morning.  Hyperglycemia: Acute stress demargination, she has been covered with sliding scale insulin as required minimal insulin. Evaluated by PCP as an outpatient with an A1c.  DVT prophylaxis: lovenox Family Communication:Brother Disposition Plan/Barrier to D/C: Hopefully in the next 24 hours. Code Status:     Code Status Orders  (From admission, onward)        Start     Ordered   01/17/17 0042  Full code  Continuous     01/17/17 0042    Code Status History    Date Active Date Inactive Code Status Order ID Comments User Context   This patient has a current code status but no historical code status.        IV Access:    Peripheral IV   Procedures and diagnostic studies:   No results found.   Medical Consultants:    None.  Anti-Infectives:   IV zosyn  Subjective:    Laurence Folz rates her pain is resolved she continues to have diarrhea.  She relates she is not as hungry as she feels will have 2-3 bites, and when she does she feels bloated.  Objective:    Vitals:   01/24/17 0941 01/24/17 1350 01/24/17 2028 01/25/17 0433  BP: (!) 154/73 (!) 156/72 (!) 141/62 (!) 145/68  Pulse: 74 83 80 72  Resp: 18 18 17 20   Temp: 98.2 F (36.8 C) 98.2 F (36.8 C) 98.3 F (36.8 C) 98.2 F (36.8 C)  TempSrc: Oral Oral Oral Oral  SpO2: 97% 96% 100% 97%  Weight:      Height:        Intake/Output Summary (Last 24 hours) at 01/25/2017 0954 Last data filed at 01/25/2017 0525 Gross per 24 hour  Intake 1254.17 ml  Output 950 ml  Net 304.17 ml   Filed Weights   01/16/17 1826  Weight: 73.5 kg (162 lb)    Exam: General exam: In no acute distress. Respiratory system: Good air movement and clear to auscultation. Cardiovascular system: S1 & S2 heard, RRR.  Gastrointestinal system: Abdomen is nondistended, soft and  nontender.  Central nervous system: Alert and oriented. No focal neurological deficits. Extremities: No pedal edema. Skin: No rashes, lesions or ulcers   Data Reviewed:    Labs: Basic Metabolic Panel: Recent Labs  Lab 01/20/17 0400 01/21/17 0407 01/22/17 0507 01/23/17 1108 01/24/17 0529 01/25/17 0443  NA 135 137 134*  --  136 135  K 3.0* 3.0* 2.7*  --  2.7* 3.5  CL 96* 102 99*  --  104 105  CO2 29 24 25   --  24 21*  GLUCOSE 126* 147* 154*  --  160* 137*  BUN 8 14 9   --  8 5*  CREATININE 0.44 0.48 0.45  --  0.44 0.40*  CALCIUM 7.9* 8.3* 7.8*  --  7.8* 8.2*  MG 2.0  --   --  2.1  --   --    GFR Estimated Creatinine Clearance: 68.6 mL/min (A) (by C-G formula based on SCr of 0.4 mg/dL (L)). Liver Function Tests: Recent Labs  Lab 01/20/17 0400 01/21/17 0407 01/22/17 0507 01/23/17 1108 01/24/17 0529  AST 30 97* 35 28 21  ALT 115* 160* 106* 77* 58*  ALKPHOS 95 225* 184* 165* 142*  BILITOT 1.8* 1.9* 2.2* 1.8* 1.1  PROT 6.1* 6.5 6.1* 6.1* 6.0*  ALBUMIN 2.4* 2.7* 2.5* 2.4* 2.4*   Recent Labs  Lab 01/20/17 0400 01/21/17 0407 01/22/17 0507 01/24/17 0529 01/25/17 0443  LIPASE 49 35 43 87* 105*   No results for input(s): AMMONIA in the last 168 hours. Coagulation profile No results for input(s): INR, PROTIME in the last 168 hours.  CBC: Recent Labs  Lab 01/19/17 0415 01/20/17 0400 01/21/17 0407 01/22/17 0507 01/24/17 0529 01/25/17 0443  WBC 11.8* 10.2 13.7* 13.6* 17.1* 16.0*  NEUTROABS 9.8*  --   --   --  14.0*  --   HGB 11.2* 10.7* 10.5* 10.9* 10.0* 10.3*  HCT 34.0* 32.0* 31.0* 31.9* 29.4* 30.9*  MCV 83.7 82.3 80.9 79.8 79.7 81.1  PLT 198 193 247 246 277 302   Cardiac Enzymes: No results for input(s): CKTOTAL, CKMB, CKMBINDEX, TROPONINI in the last 168 hours. BNP (last 3 results) No results for input(s): PROBNP in the last 8760 hours. CBG: Recent Labs  Lab 01/24/17 0743 01/24/17 1204 01/24/17 1705 01/24/17 2030 01/25/17 0739  GLUCAP 151* 109*  169* 132* 109*   D-Dimer: No results for input(s): DDIMER in the last 72 hours. Hgb A1c: No results for input(s): HGBA1C in the last 72 hours. Lipid Profile: No results for input(s): CHOL, HDL, LDLCALC, TRIG, CHOLHDL, LDLDIRECT in the last 72 hours. Thyroid function studies: No results for input(s): TSH, T4TOTAL, T3FREE, THYROIDAB in the last 72 hours.  Invalid input(s): FREET3 Anemia work up: No results for input(s): VITAMINB12, FOLATE, FERRITIN, TIBC, IRON, RETICCTPCT in the last 72 hours. Sepsis Labs: Recent Labs  Lab 01/21/17 0407 01/22/17 0507 01/24/17 0529 01/25/17 0443  WBC 13.7* 13.6* 17.1* 16.0*  Microbiology Recent Results (from the past 240 hour(s))  Culture, blood (Routine X 2) w Reflex to ID Panel     Status: None   Collection Time: 01/17/17  1:10 AM  Result Value Ref Range Status   Specimen Description BLOOD LEFT ARM  Final   Special Requests   Final    BOTTLES DRAWN AEROBIC ONLY Blood Culture adequate volume   Culture   Final    NO GROWTH 5 DAYS Performed at [facility], [address] [place], [place] [address]    Report Status 01/22/2017 FINAL  Final  Culture, blood (Routine X 2) w Reflex to ID Panel     Status: None   Collection Time: 01/17/17  1:10 AM  Result Value Ref Range Status   Specimen Description BLOOD RIGHT HAND  Final   Special Requests   Final    BOTTLES DRAWN AEROBIC AND ANAEROBIC Blood Culture adequate volume   Culture   Final    NO GROWTH 5 DAYS Performed at [facility], [address] [place], [place] [address]    Report Status 01/22/2017 FINAL  Final  Surgical pcr screen     Status: None   Collection Time: 01/20/17 10:16 AM  Result Value Ref Range Status   MRSA, PCR NEGATIVE NEGATIVE Final   Staphylococcus aureus NEGATIVE NEGATIVE Final    Comment: (NOTE) The Xpert SA Assay (FDA approved for NASAL specimens in patients 58 years of age and older), is one component of a comprehensive surveillance program. It is  not intended to diagnose infection nor to guide or monitor treatment.   C difficile quick scan w PCR reflex     Status: None   Collection Time: 01/23/17  9:05 AM  Result Value Ref Range Status   C Diff antigen NEGATIVE NEGATIVE Final   C Diff toxin NEGATIVE NEGATIVE Final   C Diff interpretation No C. difficile detected.  Final     Medications:   . enoxaparin (LOVENOX) injection  40 mg Subcutaneous Q24H  . insulin aspart  0-5 Units Subcutaneous QHS  . insulin aspart  0-9 Units Subcutaneous TID WC  . lipase/protease/amylase  24,000 Units Oral TID AC  . mouth rinse  15 mL Mouth Rinse BID  . potassium chloride  40 mEq Oral Once  . psyllium  1 packet Oral Daily  . saccharomyces boulardii  250 mg Oral BID   Continuous Infusions: . famotidine (PEPCID) IV Stopped (01/24/17 2219)  . lactated ringers with kcl 10 mL/hr at 01/23/17 1109     LOS: 9 days   Marinda Elkbraham Feliz Ortiz  [facility] Pager [phone]  *Please refer to [email or web address], password [email or web address] to get updated schedule on who will round on this patient, as hospitalists switch teams weekly. If 7PM-7AM, please contact night-coverage at [email or web address], password [email or web address] for any overnight needs.  01/25/2017, 9:54 AM

## 2017-01-26 ENCOUNTER — Encounter (HOSPITAL_COMMUNITY): Payer: Self-pay | Admitting: Gastroenterology

## 2017-01-26 DIAGNOSIS — K851 Biliary acute pancreatitis without necrosis or infection: Secondary | ICD-10-CM

## 2017-01-26 LAB — CBC WITH DIFFERENTIAL/PLATELET
Basophils Absolute: 0 10*3/uL (ref 0.0–0.1)
Basophils Relative: 0 %
Eosinophils Absolute: 0.2 10*3/uL (ref 0.0–0.7)
Eosinophils Relative: 1 %
HCT: 33.1 % — ABNORMAL LOW (ref 36.0–46.0)
Hemoglobin: 11.1 g/dL — ABNORMAL LOW (ref 12.0–15.0)
Lymphocytes Relative: 15 %
Lymphs Abs: 2.3 10*3/uL (ref 0.7–4.0)
MCH: 27.2 pg (ref 26.0–34.0)
MCHC: 33.5 g/dL (ref 30.0–36.0)
MCV: 81.1 fL (ref 78.0–100.0)
Monocytes Absolute: 0.9 10*3/uL (ref 0.1–1.0)
Monocytes Relative: 6 %
Neutro Abs: 11.8 10*3/uL — ABNORMAL HIGH (ref 1.7–7.7)
Neutrophils Relative %: 78 %
Platelets: 359 10*3/uL (ref 150–400)
RBC: 4.08 MIL/uL (ref 3.87–5.11)
RDW: 15.3 % (ref 11.5–15.5)
WBC: 15.2 10*3/uL — ABNORMAL HIGH (ref 4.0–10.5)

## 2017-01-26 LAB — BASIC METABOLIC PANEL
ANION GAP: 7 (ref 5–15)
BUN: 5 mg/dL — ABNORMAL LOW (ref 6–20)
CALCIUM: 8.1 mg/dL — AB (ref 8.9–10.3)
CO2: 23 mmol/L (ref 22–32)
Chloride: 107 mmol/L (ref 101–111)
Creatinine, Ser: 0.45 mg/dL (ref 0.44–1.00)
GFR calc Af Amer: >60 mL/min (ref >60)
GLUCOSE: 133 mg/dL — AB (ref 65–99)
Potassium: 3.4 mmol/L — ABNORMAL LOW (ref 3.5–5.1)
SODIUM: 137 mmol/L (ref 135–145)

## 2017-01-26 LAB — GLUCOSE, CAPILLARY
GLUCOSE-CAPILLARY: 142 mg/dL — AB (ref 65–99)
Glucose-Capillary: 132 mg/dL — ABNORMAL HIGH (ref 65–99)

## 2017-01-26 MED ORDER — POTASSIUM CHLORIDE CRYS ER 20 MEQ PO TBCR
40.0000 meq | EXTENDED_RELEASE_TABLET | Freq: Once | ORAL | Status: AC
  Administered 2017-01-26: 40 meq via ORAL
  Filled 2017-01-26: qty 2

## 2017-01-26 MED ORDER — PANCRELIPASE (LIP-PROT-AMYL) 36000-114000 UNITS PO CPEP: 36000.0000 [IU] | ORAL_CAPSULE | Freq: Three times a day (TID) | ORAL | 0 refills | Status: AC

## 2017-01-26 MED ORDER — OXYCODONE HCL 5 MG PO TABS: 5.0000 mg | ORAL_TABLET | Freq: Four times a day (QID) | ORAL | 0 refills | Status: AC | PRN

## 2017-01-26 MED ORDER — HYDROCORTISONE 2.5 % RE CREA: 1.0000 "application " | TOPICAL_CREAM | Freq: Four times a day (QID) | RECTAL | 0 refills | Status: AC | PRN

## 2017-01-26 NOTE — Progress Notes (Signed)
Dr. Dwain SarnaWakefield aware via phone pt being discharged today. Md aware pt from out of town and will likely be here for a couple weeks. Clarified need for follow up care with [facility]. See addendum to AVS regarding follow up per Dr. Dwain SarnaWakefield.

## 2017-01-26 NOTE — Progress Notes (Signed)
Assessment unchanged. Pt and family verbalized understanding of dc instructions through teach back including follow up care with [facility] in 1 week, diet, activity, lifting restrictions, as well as when to call MD if issues occur. Discharged via wc to front entrance accompanied by nurse and family.

## 2017-01-26 NOTE — Progress Notes (Signed)
Pt had concerns about blood work indicated to check within 4 days on AVS. Dr. Sallee Provencalbgata aware via phone pt would not be returning to [place] to see PCP for a week or so. MD responded "those labs were not urgent". Conveyed information to pt with verbalized understanding.

## 2017-01-26 NOTE — Discharge Instructions (Signed)
[facility]: POST OP INSTRUCTIONS  Always review your discharge instruction sheet given to you by the facility where your surgery was performed. IF YOU HAVE DISABILITY OR FAMILY LEAVE FORMS, YOU MUST BRING THEM TO THE OFFICE FOR PROCESSING.   DO NOT GIVE THEM TO YOUR DOCTOR.  1. A prescription for pain medication may be given to you upon discharge.  Take your pain medication as prescribed, if needed.  If narcotic pain medicine is not needed, then you may take acetaminophen (Tylenol), naprosyn (Alleve), or ibuprofen (Advil) as needed. 2. Take your usually prescribed medications unless otherwise directed. 3. If you need a refill on your pain medication, please contact your pharmacy.  They will contact our office to request authorization. Prescriptions will not be filled after 5pm or on week-ends. 4. You should follow a light diet the first few days after arrival home, such as soup and crackers, etc.  Be sure to include lots of fluids daily. 5. Most patients will experience some swelling and bruising in the area of the incisions.  Ice packs will help.  Swelling and bruising can take several days to resolve.  6. It is common to experience some constipation if taking pain medication after surgery.  Increasing fluid intake and taking a stool softener (such as Colace) will usually help or prevent this problem from occurring.  A mild laxative (Milk of Magnesia or Miralax) should be taken according to package instructions if there are no bowel movements after 48 hours. 7. Unless discharge instructions indicate otherwise, you may remove your bandages 48 hours after surgery, and you may shower at that time.  You may have steri-strips (small skin tapes) in place directly over the incision.  These strips should be left on the skin for 7-10 days.  If your surgeon used skin glue on the incision, you may shower in 24 hours.  The glue will flake  off over the next 2-3 weeks.  Any sutures or staples will be removed at the office during your follow-up visit. 8. ACTIVITIES:  You may resume regular (light) daily activities beginning the next day--such as daily self-care, walking, climbing stairs--gradually increasing activities as tolerated.  You may have sexual intercourse when it is comfortable.  Refrain from any heavy lifting or straining until approved by your doctor. a. You may drive when you are no longer taking prescription pain medication, you can comfortably wear a seatbelt, and you can safely maneuver your car and apply brakes. b. RETURN TO WORK:  __________________________________________________________ 9. You should see your doctor in the office for a follow-up appointment approximately 2-3 weeks after your surgery.  Make sure that you call for this appointment within a day or two after you arrive home to insure a convenient appointment time. 10. OTHER INSTRUCTIONS: __________________________________________________________________________________________________________________________ __________________________________________________________________________________________________________________________ WHEN TO CALL YOUR DOCTOR: 1. Fever over 101.0 2. Inability to urinate 3. Continued bleeding from incision. 4. Increased pain, redness, or drainage from the incision. 5. Increasing abdominal pain  The clinic staff is available to answer your questions during regular business hours.  Please don't hesitate to call and ask to speak to one of the nurses for clinical concerns.  If you have a medical emergency, go to the nearest emergency room or call 911.  A surgeon from [facility] is always on call at the hospital. [address], [address], [place], [place]  [address] ? [address] [address], [place], [place]   [address] [phone] ? [phone] ? [phone] Web site: [email or web address]  

## 2017-01-26 NOTE — Discharge Summary (Signed)
Physician Discharge Summary  Patient ID: Traci Sermonndra Headen MRN: [id number] DOB/AGE: 1960/04/10 56 y.o.  Admit date: 01/16/2017 Discharge date: 01/26/2017  Admission Diagnoses:  Discharge Diagnoses:  Principal Problem:   Gallstone pancreatitis Active Problems:   Cholecystitis   Cholelithiasis   Abnormal liver function   Hyperglycemia   Abdominal pain   DM (diabetes mellitus) (HCC)   Necrotizing pancreatitis   Discharged Condition: stable  Hospital Course: Patient is a 56 year old female with no known past medical history. Patient was admitted with severe, necrotizing pancreatitis and gallstone cholecystitis. Patient underwent ERCP and cholecystectomy. Pancreatitis was also managed supportively. Patient has improved significantly, and will be discharged back home today. During the hospital stay, patient had diarrhea, but stool analysis was non revealing. Hypokalemia and Leukocytosis were noted. Hypokalemia has resolved significantly. Patient will need BMP and CBC checked within the next 4 days. Diarrhea has resolved.  Consults: GI and Surgery  Significant Diagnostic Studies: ERCP  Treatments: surgery: S/P Cholecystectomy.  Discharge Exam: Blood pressure (!) 135/51, pulse 71, temperature 98 F (36.7 C), temperature source Oral, resp. rate 20, height 5' (1.524 m), weight 73.5 kg (162 lb), SpO2 99 %.   Disposition: Final discharge disposition not confirmed  Discharge Instructions    Call MD for:   Complete by:  As directed    Call MD for worsening of symptoms   Diet - low sodium heart healthy   Complete by:  As directed    Discharge instructions   Complete by:  As directed    Check BMP and CBC within 4 days   Increase activity slowly   Complete by:  As directed      Allergies as of 01/26/2017   No Known Allergies     Medication List    TAKE these medications   hydrocortisone 2.5 % rectal cream Commonly known as:  ANUSOL-HC Apply 1 application topically 4 (four)  times daily as needed for hemorrhoids.   lipase/protease/amylase [id number] UNITS Cpep capsule Commonly known as:  CREON Take 1 capsule (36,000 Units total) by mouth 3 (three) times daily before meals.   oxyCODONE 5 MG immediate release tablet Commonly known as:  Oxy IR/ROXICODONE Take 1 tablet (5 mg total) by mouth every 6 (six) hours as needed for moderate pain or severe pain.      Follow-up Information    [facility].   Specialty:  General Surgery Why:  Call as needed with questions or concerns. Follow up with your primary care provider once you are home.  Contact information: [address] [place] [address] [phone]           Signed: Barnetta ChapelSylvester I Laryah Neuser 01/26/2017, 11:58 AM

## 2019-03-03 IMAGING — US US ABDOMEN LIMITED
1 series · 14 of 25 positions shown · non-contrast
Comparison: None.

CLINICAL DATA: Severe right upper quadrant and epigastric pain for
the last several hours.

EXAM:
ULTRASOUND ABDOMEN LIMITED RIGHT UPPER QUADRANT

[Series 1: us abdomen limited · 0.15mm/px · 40 acquisitions, 14 frames shown]
[im 1/40]
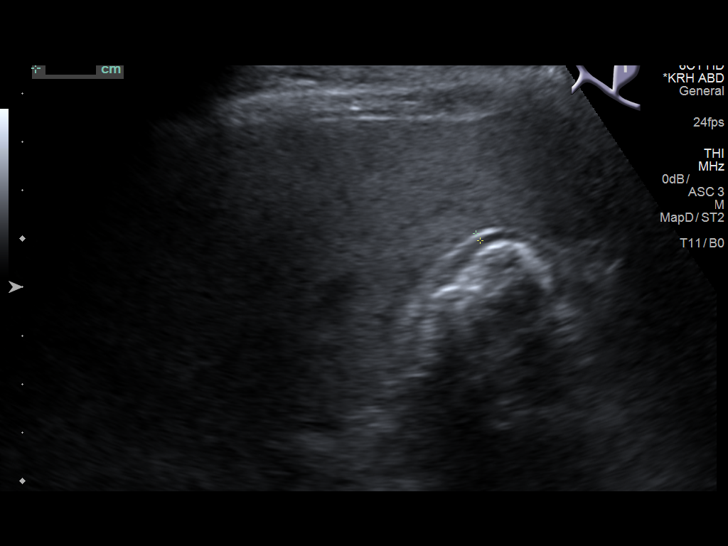
[im 4/40]
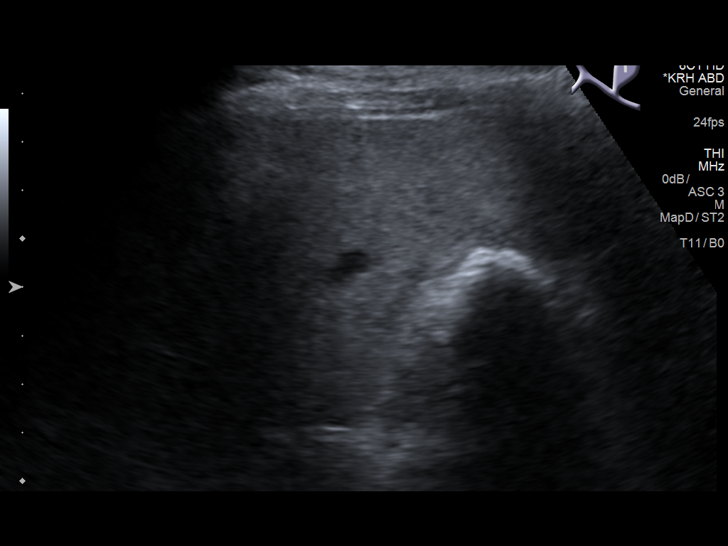
[im 7/40]
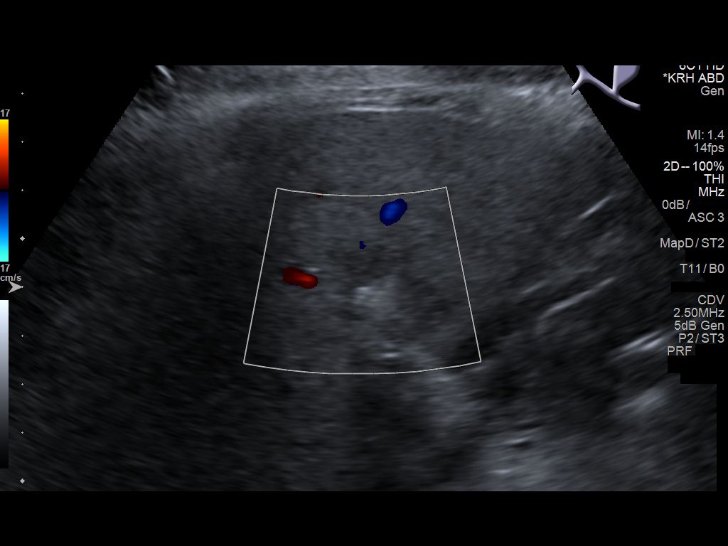
[im 10/40]
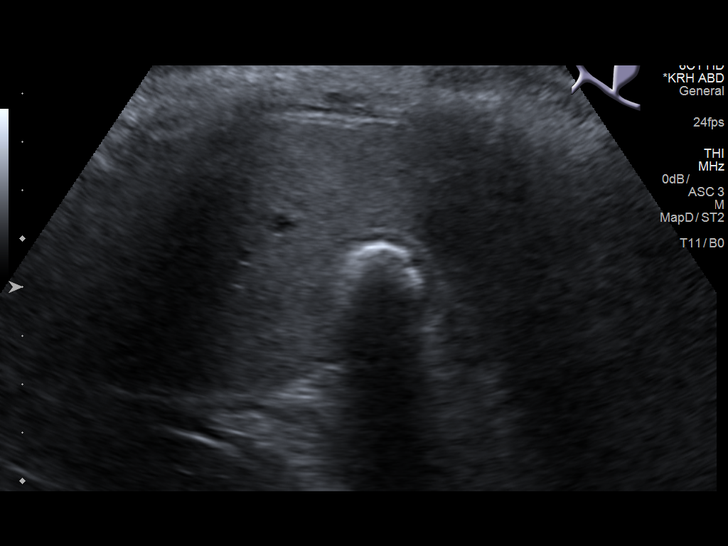
[im 14/40]
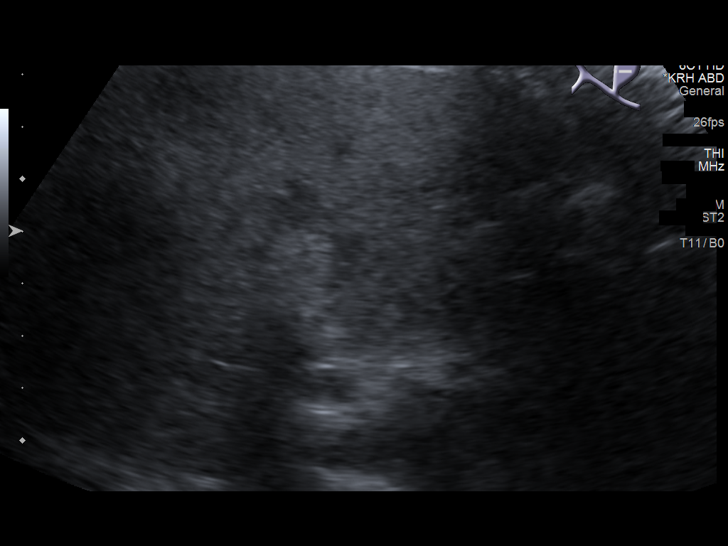
[im 15/40]
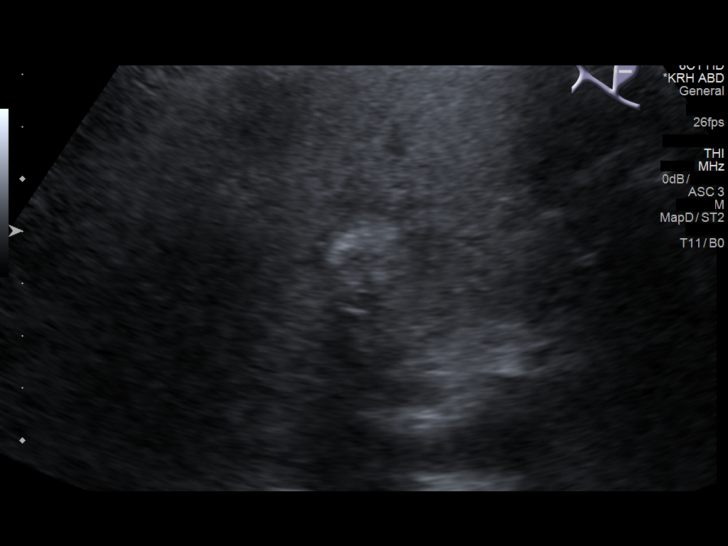
[im 18/40]
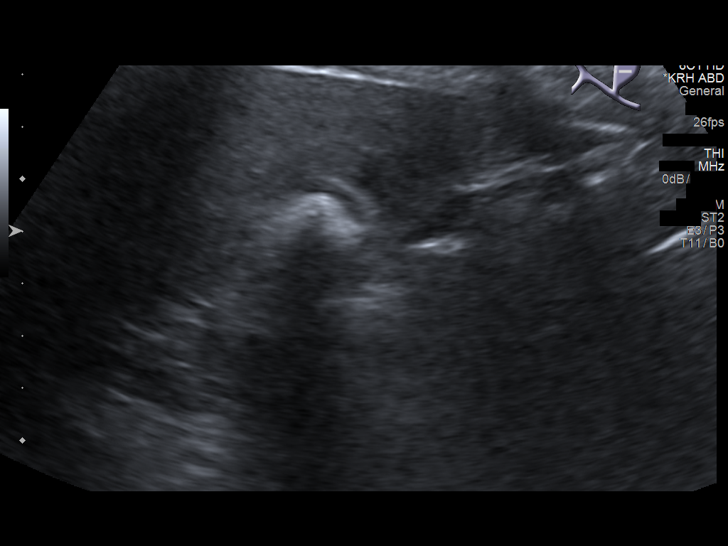
[im 22/40]
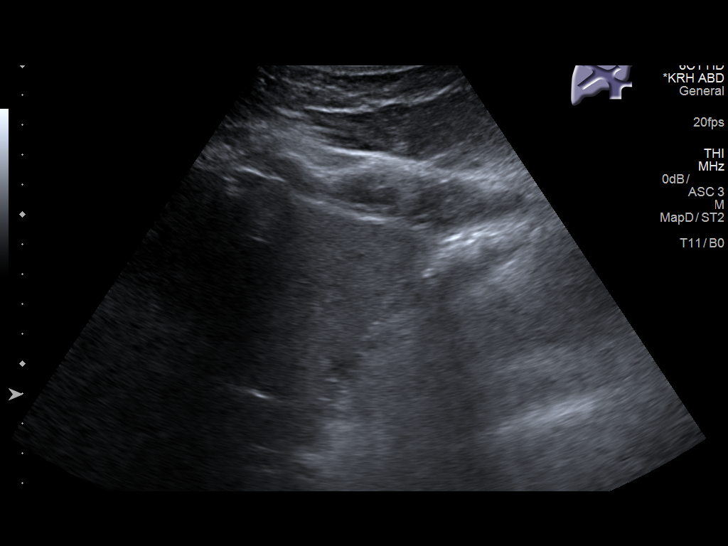
[im 25/40]
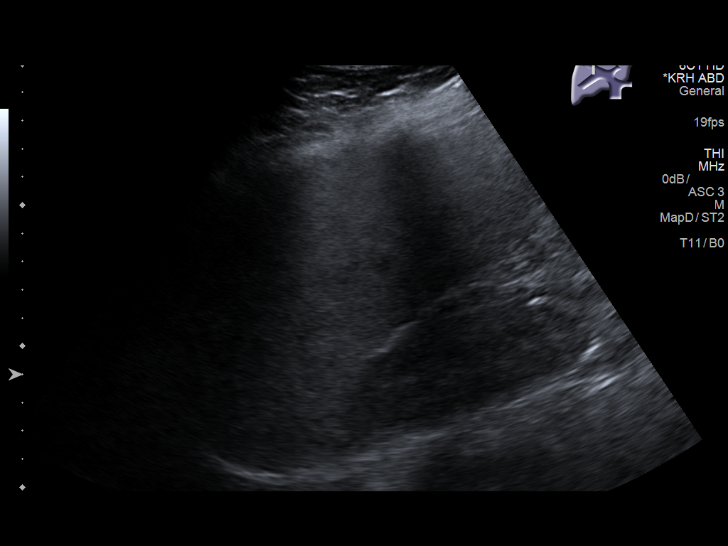
[im 27/40]
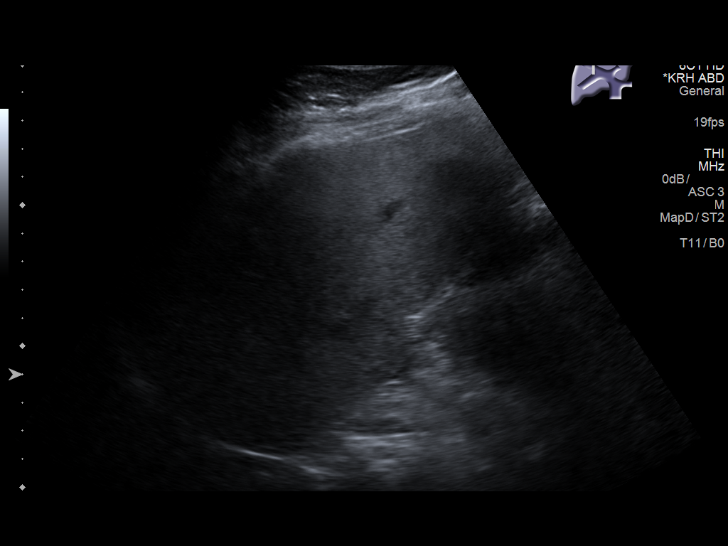
[im 30/40]
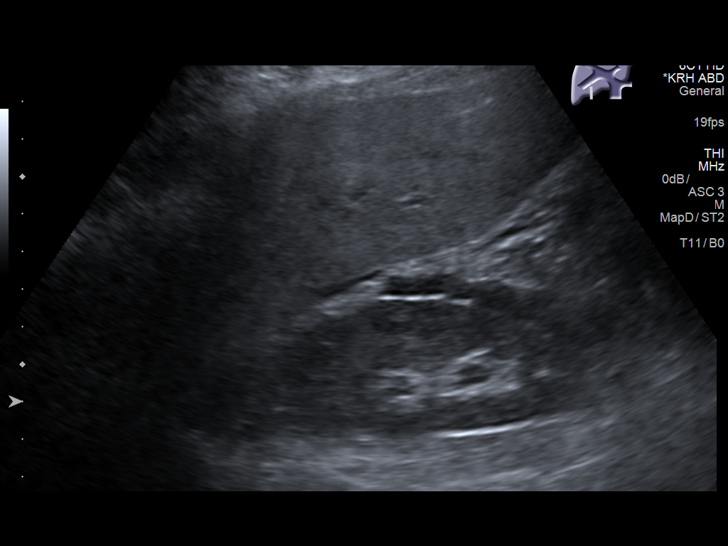
[im 33/40]
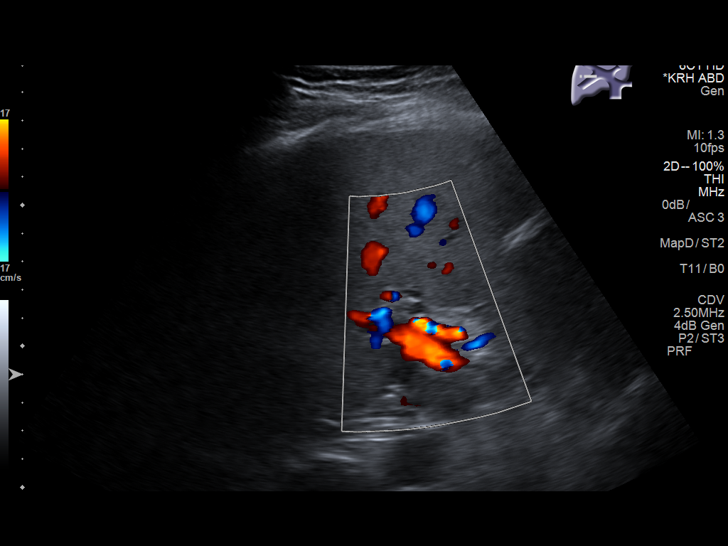
[im 36/40]
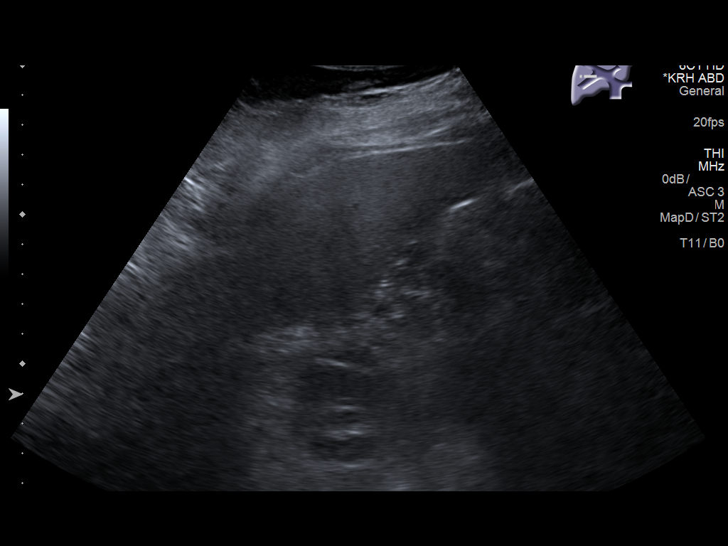
[im 40/40]
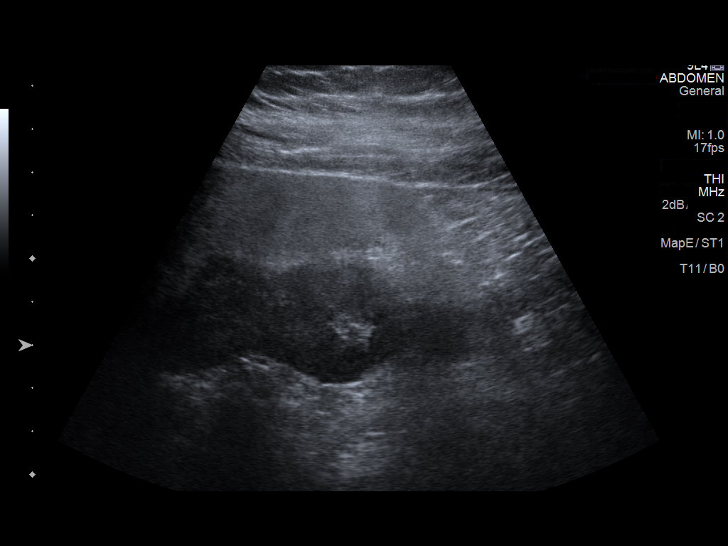

[14 of 25 positions shown; findings below may reference images not displayed]

FINDINGS: Gallbladder:

Gallbladder full of stones. Mild surrounding fluid. Largest stone
measured at 1.7 cm. Positive Murphy sign.

Common bile duct:

Diameter: 3.6 mm common normal

Liver:

Echogenic liver suggesting fatty change. No focal lesion or ductal
dilatation. Portal vein is patent on color Doppler imaging with
normal direction of blood flow towards the liver.

There appears to be some fluid in the right upper quadrant that may
be reactive to the inflammatory changes.
IMPRESSION: Gallbladder full of stones. Positive Murphy sign. Mild surrounding
fluid. Findings suggest cholecystitis.

Fatty liver.

## 2019-06-17 IMAGING — CT CT CTA ABD/PEL W/CM AND/OR W/O CM
2 of 14 series · 7 of 46 positions shown, 13 images · IV contrast (iopamidol)
Comparison: MRI/MRCP on 01/17/2017

CLINICAL DATA: Acute pancreatitis with MRI demonstrating
nonocclusive thrombus in the superior mesenteric vein and poor
opacification of the celiac axis and its branches. CTA is performed
to evaluate arterial patency and re-evaluate mesenteric venous
thrombus.

EXAM:
CT ANGIOGRAPHY ABDOMEN AND PELVIS WITH CONTRAST AND WITHOUT CONTRAST
TECHNIQUE: Multidetector CT imaging of the abdomen and pelvis was performed
using the standard protocol during bolus administration of
intravenous contrast. Multiplanar reconstructed images and MIPs were
obtained and reviewed to evaluate the vascular anatomy.
CONTRAST:  100mL [id number] IOPAMIDOL ([id number]) INJECTION 76%

[Series 10: axial venous · axial · portal-venous · 0.75mm/px · z∈[-396,-62]mm · 6 of 235 slices shown, 11 images]
[im 34/235  soft-tissue]
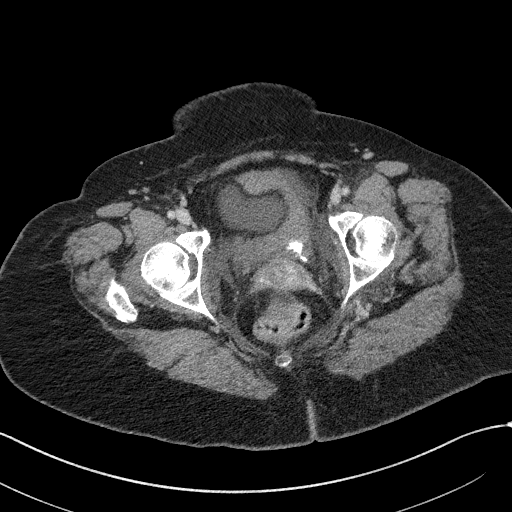
[im 34/235  bone]
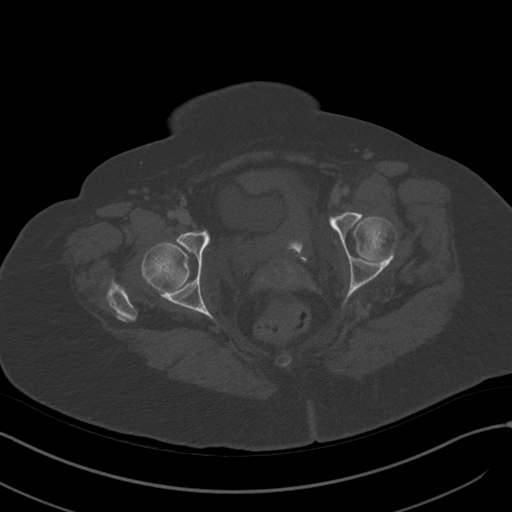
[im 67/235  soft-tissue]
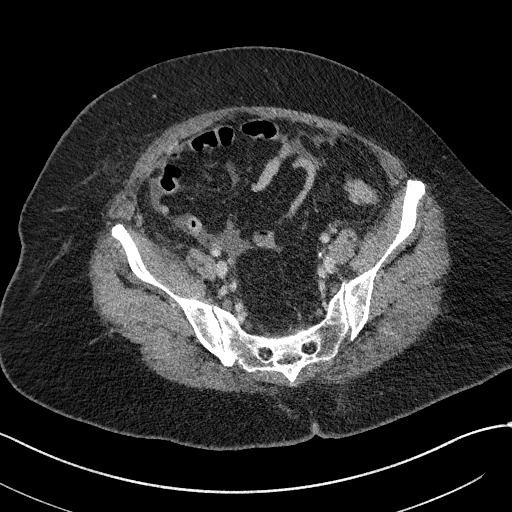
[im 101/235  soft-tissue]
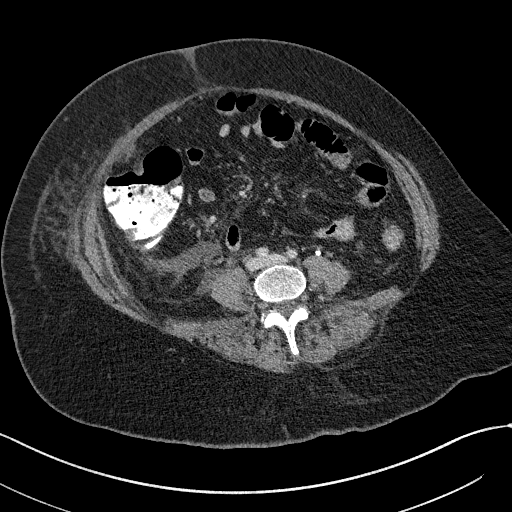
[im 101/235  lung]
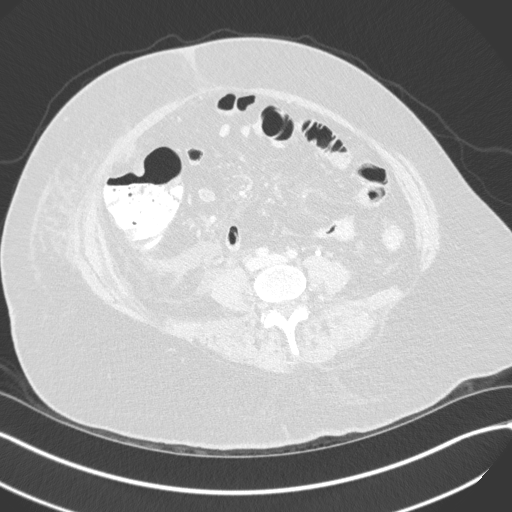
[im 134/235  soft-tissue]
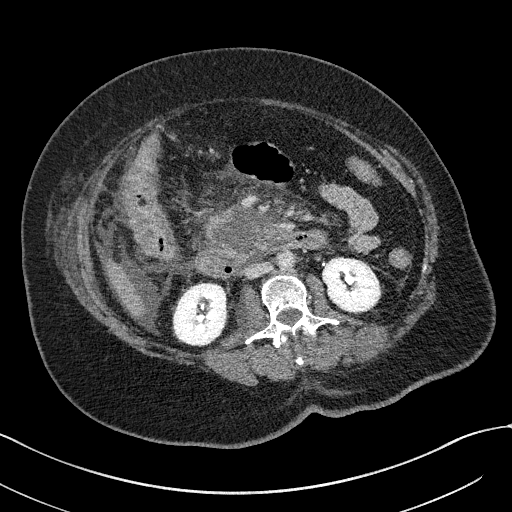
[im 134/235  lung]
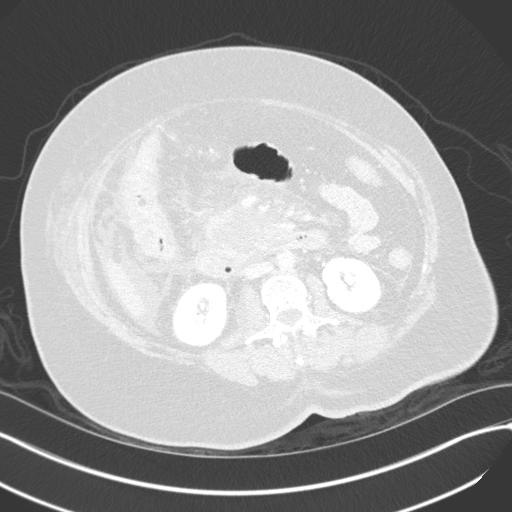
[im 168/235  soft-tissue]
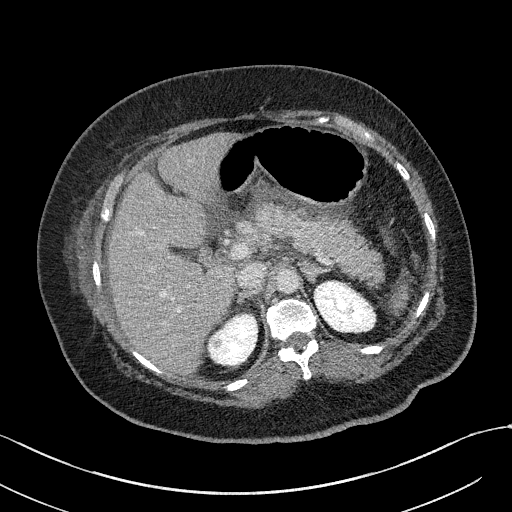
[im 168/235  lung]
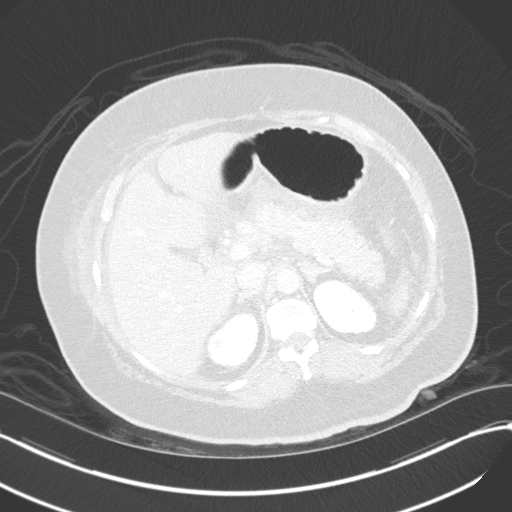
[im 201/235  soft-tissue]
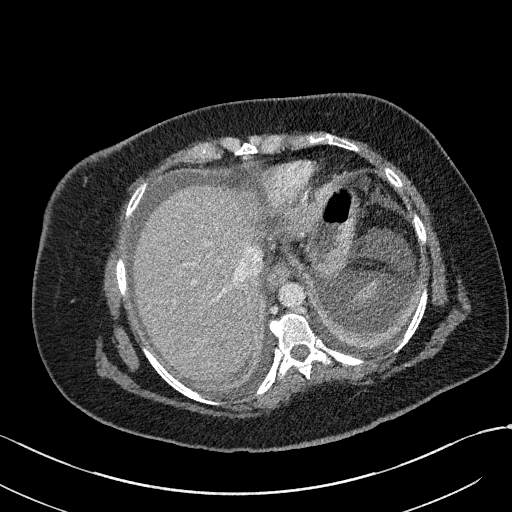
[im 201/235  lung]
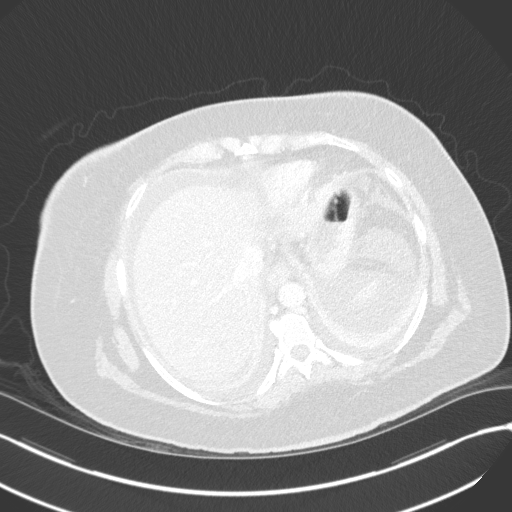

[Series 17: coronal mpr · coronal · 0.26mm/px · 1 of 137 slices shown, 2 images]
[im 69/137  soft-tissue]
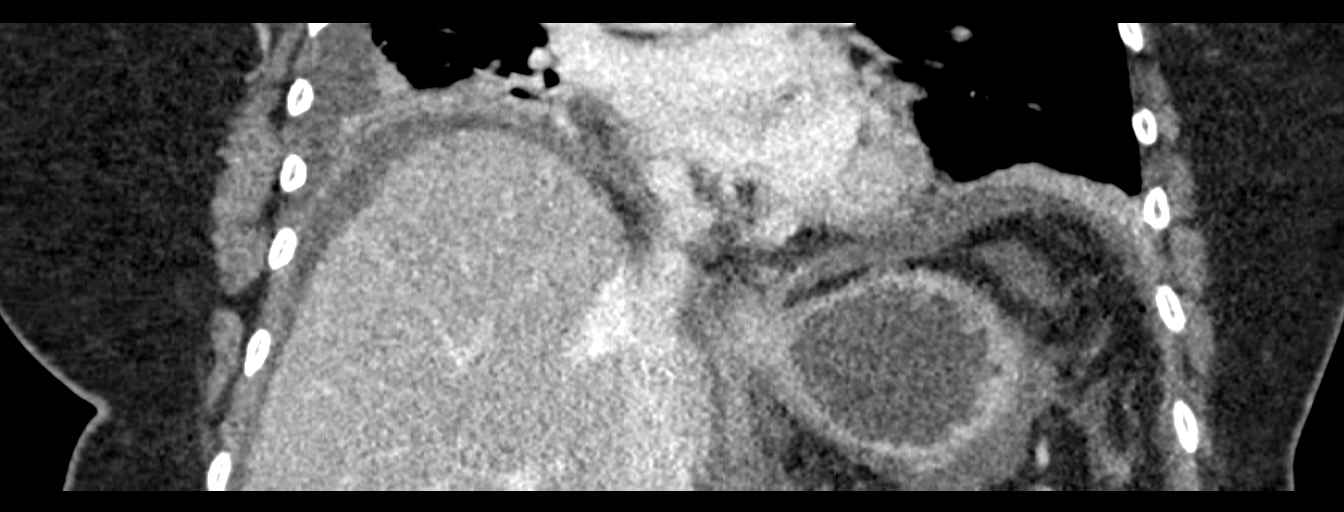
[im 69/137  bone]
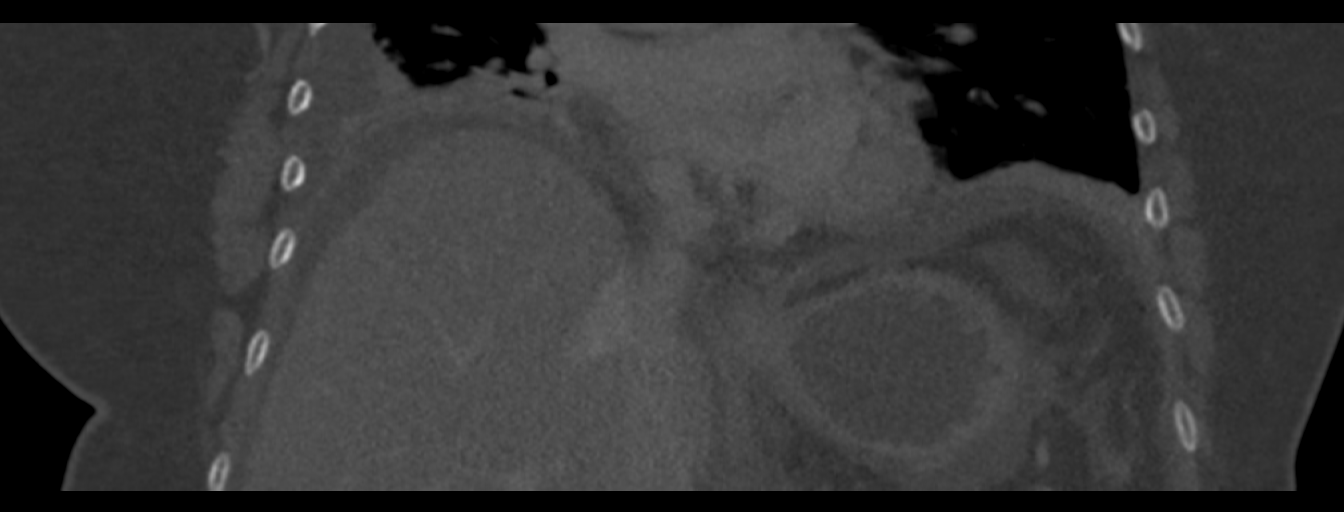

[7 of 46 positions shown; findings below may reference images not displayed]

FINDINGS: VASCULAR

Aorta: The abdominal aorta is normally patent and demonstrates
normal caliber without evidence of atherosclerosis, aneurysm or
stenosis.

Celiac: The celiac axis is normally patent including branch vessels.
No evidence of arterial occlusion or pseudoaneurysm. Splenic and
hepatic artery's are normally patent. The gastroduodenal artery
shows normal patency.

SMA: The superior mesenteric artery and its branches are normally
patent.

Renals: Bilateral single renal artery's are widely patent.

IMA: Normally patent.

Inflow: Normally patent bilateral iliac artery is.

Proximal Outflow: Normally patent bilateral common femoral artery's
and femoral bifurcations.

Veins: Venous phase imaging again demonstrates similar findings to
the MRI study with nonocclusive thrombus present within a focal
segment of the main superior mesenteric vein trunk along its right
lumen. There is no evidence of portal vein thrombus. The splenic
vein is normally patent. IVC, iliac veins and common femoral veins
show normal patency.

Review of the MIP images confirms the above findings.

NON-VASCULAR

Lower chest: Bilateral small pleural effusions present with
associated bilateral lower lobe atelectasis, right greater than
left.

Hepatobiliary: Severe steatosis of the liver without evidence of
overt cirrhosis. No focal masses identified in the liver. The
gallbladder is contracted around multiple large calcified
gallstones. No evidence of biliary ductal dilatation. No evidence of
choledocholithiasis by CT.

Pancreas: Extensive pancreatitis noted with venous phase imaging
showing necrosis of approximately 50- 70% of the pancreatic head
extending into the neck of the pancreas. The body and tail of the
pancreas enhance normally. Extensive inflammation is seen
surrounding the pancreas with peripancreatic and retroperitoneal
fluid present. Perihepatic and perisplenic fluid also present. Fluid
also present in the lesser sac. No focal discrete abscess or
pseudocyst identified. No evidence of pancreatic ductal dilatation,
focal pancreatic mass or ductal calculi.

Spleen: Normal in size without focal abnormality.

Adrenals/Urinary Tract: Adrenal glands are unremarkable. Kidneys are
normal, without renal calculi, focal lesion, or hydronephrosis. The
bladder is decompressed by a Foley catheter.

Stomach/Bowel: Bowel shows no evidence of obstruction. No free air.
No significant ileus.

Lymphatic: No enlarged lymph nodes identified in the abdomen or
pelvis.

Reproductive: Uterus and bilateral adnexa are unremarkable.

Other: Scattered free fluid in the peritoneal cavity extends into
the pelvis. No hernias identified.

Musculoskeletal: No acute or significant osseous findings.
IMPRESSION: VASCULAR

1. Similar to the MRI, nonocclusive thrombus is identified in a
short segment of the main trunk of the superior mesenteric vein. No
evidence of portal vein thrombus.
2. No evidence of arterial compromise with normal patency of the
mesenteric arteries by CTA. No arterial pseudoaneurysm or hemorrhage
identified.

NON-VASCULAR

1. Extensive pancreatitis with overt necrosis of the pancreatic head
by CT. Approximately 50- 70% of the pancreatic head demonstrates
lack of enhancement on venous phase imaging consistent with
necrosis. No associated pancreatic abscess or discrete pseudocyst is
identified at this time.
2. Cholelithiasis with calcified gallstones in a contracted
gallbladder. No imaging findings to suggest acute cholecystitis,
choledocholithiasis or biliary obstruction.
3. Severe hepatic steatosis without overt cirrhosis by CT. No
hepatic masses identified.

## 2019-06-19 IMAGING — RF DG CHOLANGIOGRAM OPERATIVE
1 series · 4 of 4 positions shown · non-contrast
Comparison: CT 01/18/2017

CLINICAL DATA: 55-year-old female with a history of cholelithiasis

EXAM:
INTRAOPERATIVE CHOLANGIOGRAM
TECHNIQUE: Cholangiographic images from the C-arm fluoroscopic device were
submitted for interpretation post-operatively. Please see the
procedural report for the amount of contrast and the fluoroscopy
time utilized.
FLUOROSCOPY TIME:  Fluoroscopy Time:  19 seconds

[Series 1: run · 4 of 122 frames shown]
[frame 19/122]
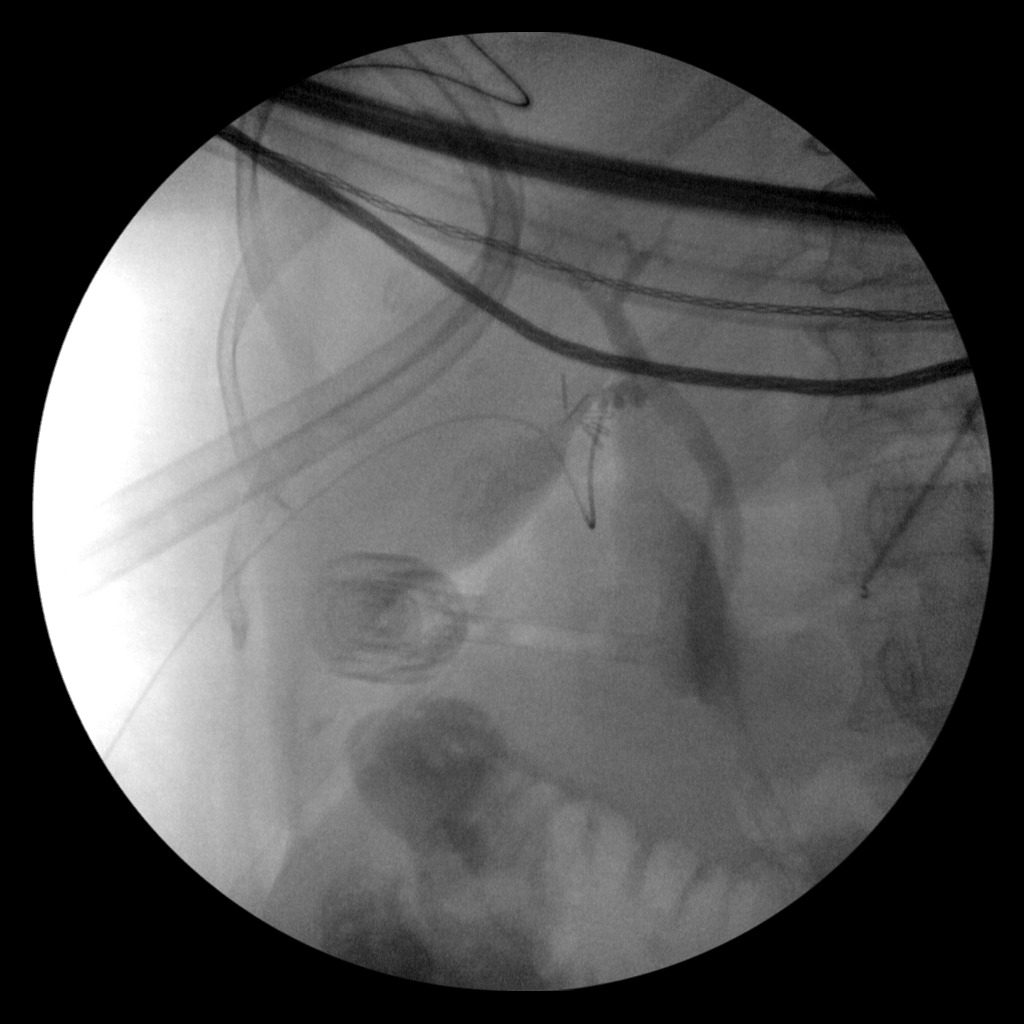
[frame 62/122]
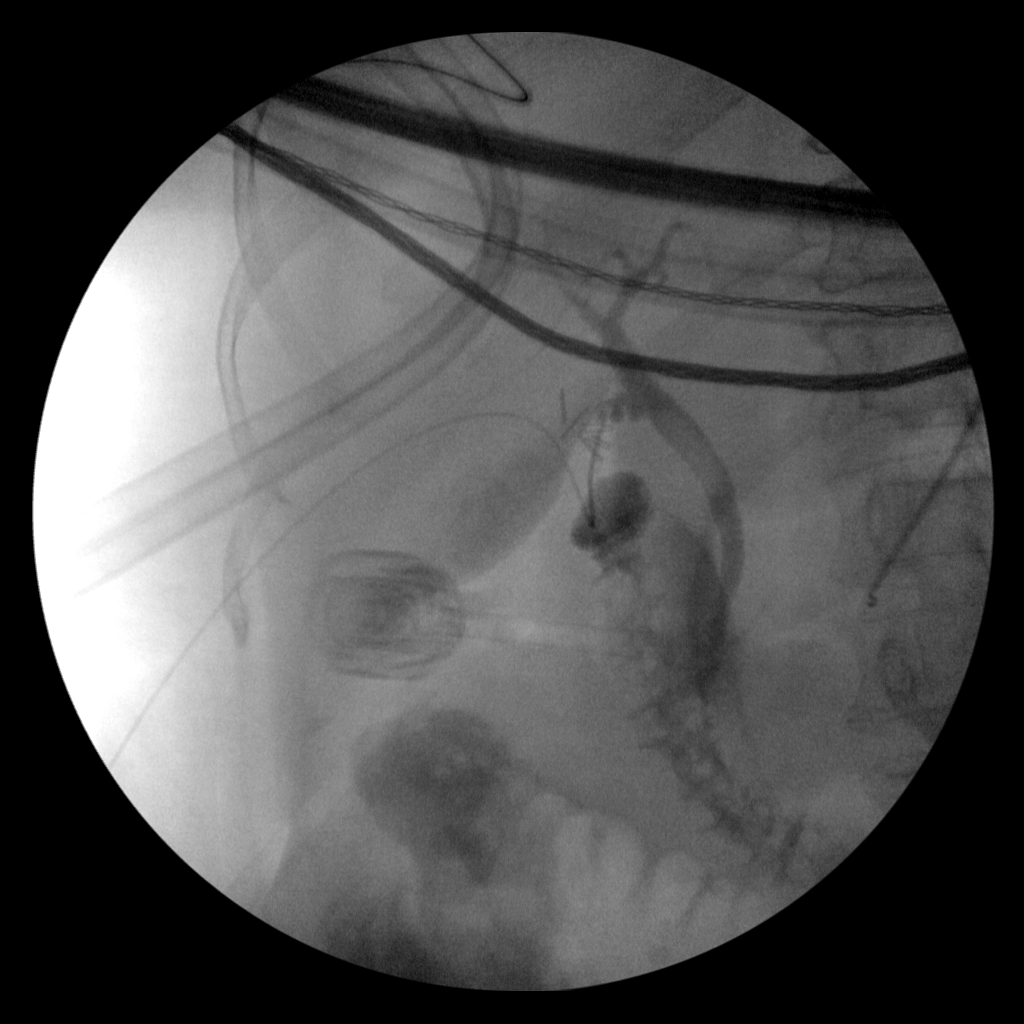
[frame 97/122]
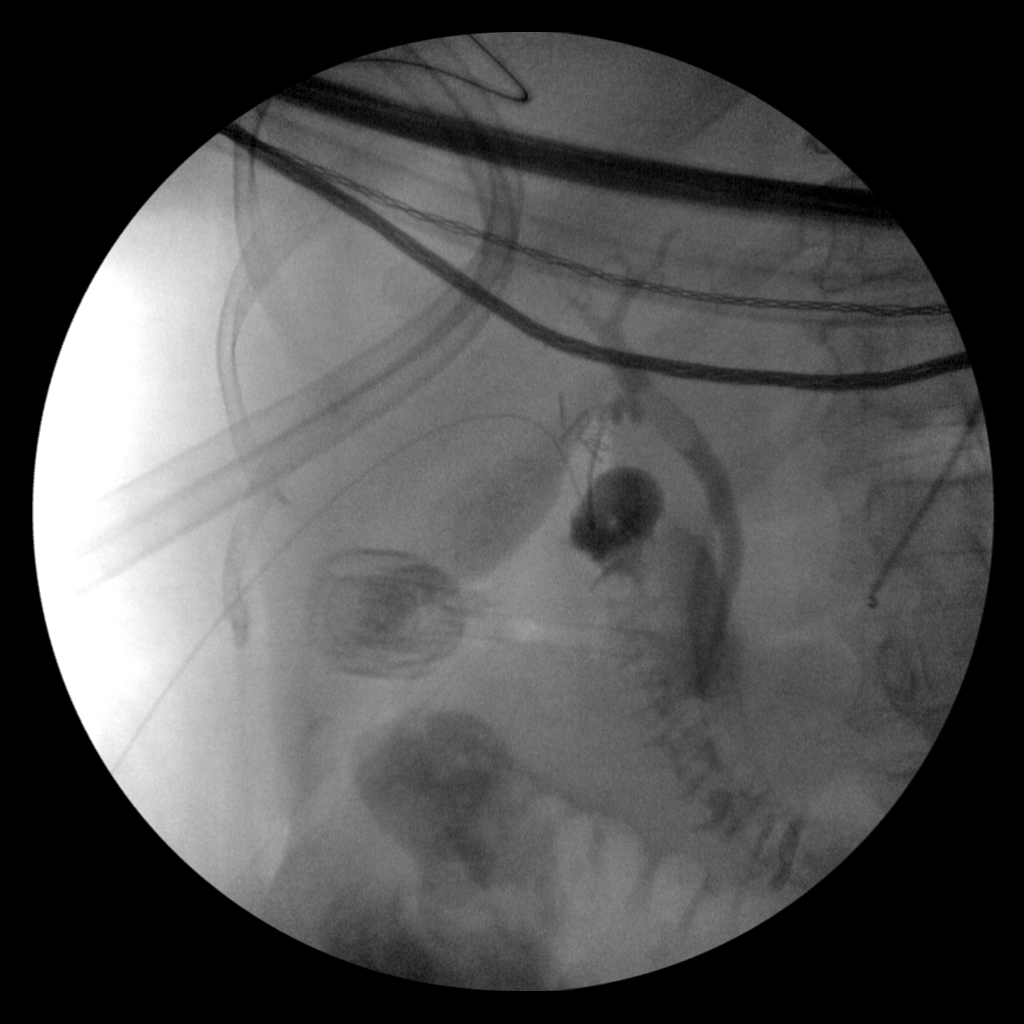
[frame 104/122]
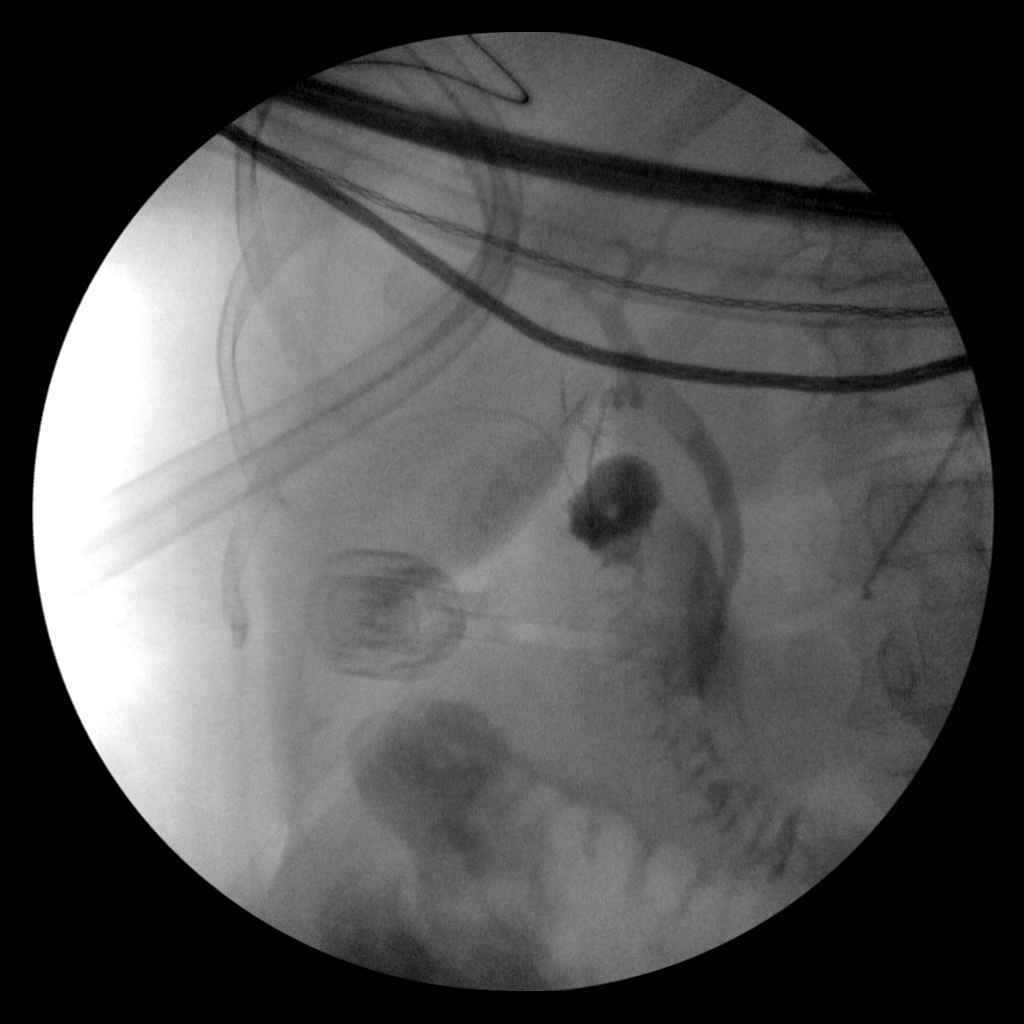

[4 of 4 positions shown; findings below may reference images not displayed]

FINDINGS: Surgical instruments project over the upper abdomen.

There is cannulation of the cystic duct/gallbladder neck, with
antegrade infusion of contrast. Caliber of the extrahepatic ductal
system within normal limits.

Rounded filling defect within the common bile duct is mobile.

Free flow of contrast across the ampulla.
IMPRESSION: Intraoperative cholangiogram demonstrates extrahepatic biliary ducts
of unremarkable caliber, with a rounded mobile filling defect within
the common bile duct, potentially an air bubble, however, debris/
sludge not excluded. Free flow of contrast across the ampulla.

Please refer to the dictated operative report for full details of
intraoperative findings and procedure
